# Patient Record
Sex: Female | Born: 1985 | Race: White | Hispanic: No | Marital: Married | Smoking: Never smoker
Health system: Southern US, Community
[De-identification: ages and names within clinical notes are randomized; demographics above are authoritative.]

## PROBLEM LIST (undated history)

## (undated) ENCOUNTER — Inpatient Hospital Stay (HOSPITAL_COMMUNITY): Payer: Self-pay

## (undated) DIAGNOSIS — K8020 Calculus of gallbladder without cholecystitis without obstruction: Secondary | ICD-10-CM

## (undated) DIAGNOSIS — F32A Depression, unspecified: Secondary | ICD-10-CM

## (undated) DIAGNOSIS — F329 Major depressive disorder, single episode, unspecified: Secondary | ICD-10-CM

## (undated) DIAGNOSIS — Z8619 Personal history of other infectious and parasitic diseases: Secondary | ICD-10-CM

## (undated) DIAGNOSIS — E282 Polycystic ovarian syndrome: Secondary | ICD-10-CM

## (undated) HISTORY — PX: OTHER SURGICAL HISTORY: SHX169

## (undated) HISTORY — DX: Polycystic ovarian syndrome: E28.2

## (undated) HISTORY — PX: CHOLECYSTECTOMY: SHX55

## (undated) HISTORY — DX: Personal history of other infectious and parasitic diseases: Z86.19

## (undated) HISTORY — DX: Major depressive disorder, single episode, unspecified: F32.9

## (undated) HISTORY — DX: Calculus of gallbladder without cholecystitis without obstruction: K80.20

## (undated) HISTORY — DX: Depression, unspecified: F32.A

---
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
## 2003-10-05 ENCOUNTER — Other Ambulatory Visit: Admission: RE | Admit: 2003-10-05 | Discharge: 2003-10-05 | Payer: Self-pay | Admitting: Obstetrics and Gynecology

## 2004-10-10 ENCOUNTER — Other Ambulatory Visit: Admission: RE | Admit: 2004-10-10 | Discharge: 2004-10-10 | Payer: Self-pay | Admitting: Obstetrics and Gynecology

## 2005-11-27 ENCOUNTER — Other Ambulatory Visit: Admission: RE | Admit: 2005-11-27 | Discharge: 2005-11-27 | Payer: Self-pay | Admitting: Obstetrics and Gynecology

## 2005-12-18 ENCOUNTER — Encounter: Admission: RE | Admit: 2005-12-18 | Discharge: 2005-12-18 | Payer: Self-pay | Admitting: Gastroenterology

## 2006-12-17 ENCOUNTER — Other Ambulatory Visit: Admission: RE | Admit: 2006-12-17 | Discharge: 2006-12-17 | Payer: Self-pay | Admitting: Obstetrics and Gynecology

## 2007-12-30 ENCOUNTER — Encounter: Payer: Self-pay | Admitting: Obstetrics and Gynecology

## 2007-12-30 ENCOUNTER — Other Ambulatory Visit: Admission: RE | Admit: 2007-12-30 | Discharge: 2007-12-30 | Payer: Self-pay | Admitting: Obstetrics and Gynecology

## 2007-12-30 ENCOUNTER — Ambulatory Visit: Payer: Self-pay | Admitting: Obstetrics and Gynecology

## 2008-06-30 ENCOUNTER — Ambulatory Visit: Payer: Self-pay | Admitting: Obstetrics and Gynecology

## 2008-07-07 IMAGING — US US ABDOMEN COMPLETE
1 series · 14 of 25 positions shown · non-contrast
Comparison: None.

ABDOMEN ULTRASOUND:

CLINICAL DATA: Abdominal pain
TECHNIQUE: Complete abdominal ultrasound examination was performed including
evaluation of the liver, gallbladder, bile ducts, pancreas, kidneys, spleen,
IVC, and abdominal aorta.

[Series 1: unknown · 14 of 67 slices shown]
[im 1/67]
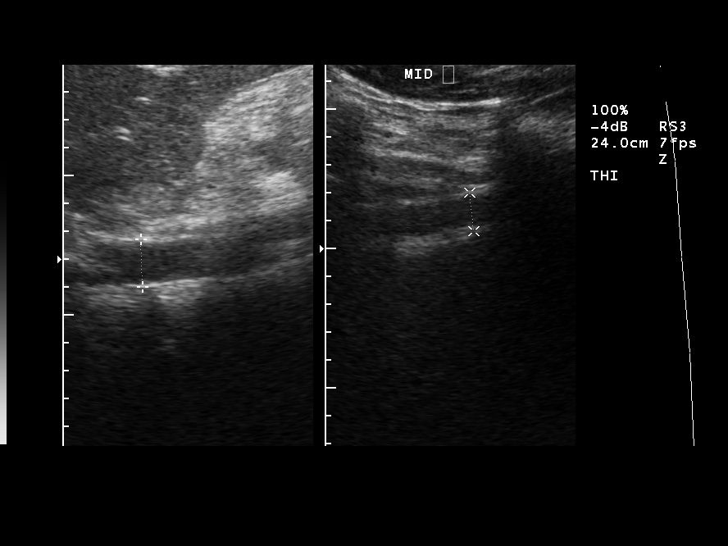
[im 6/67]
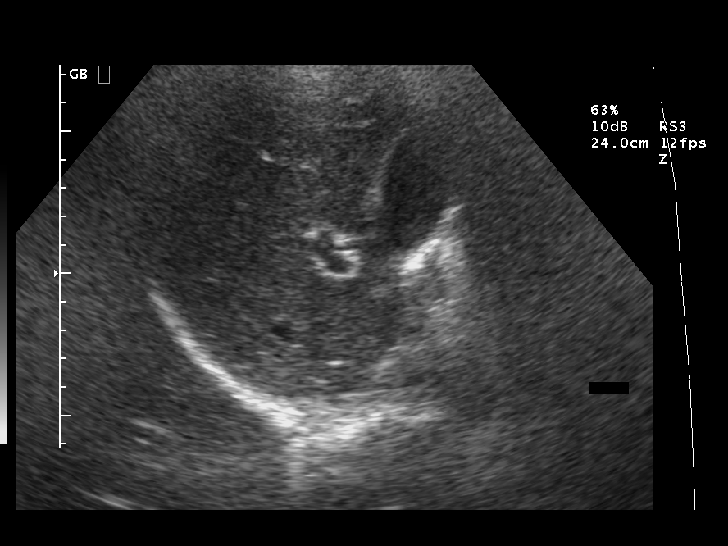
[im 12/67]
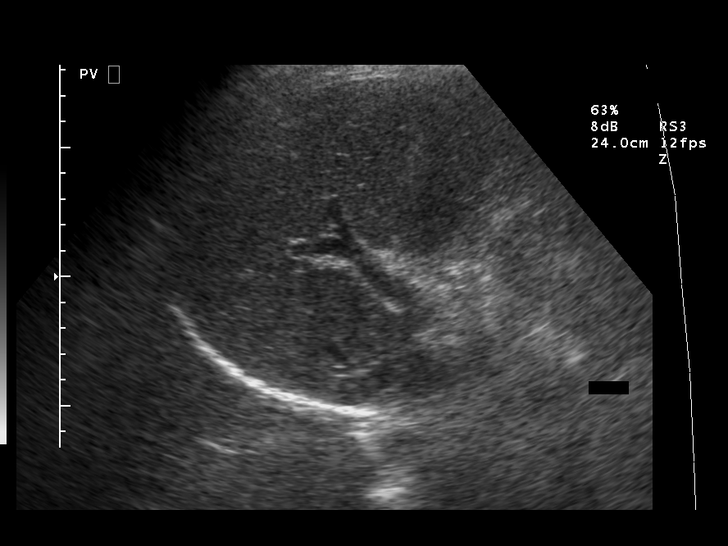
[im 17/67]
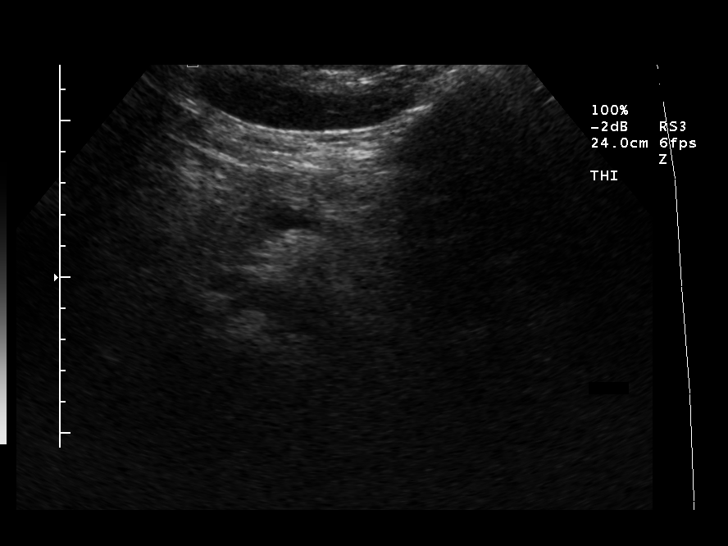
[im 23/67]
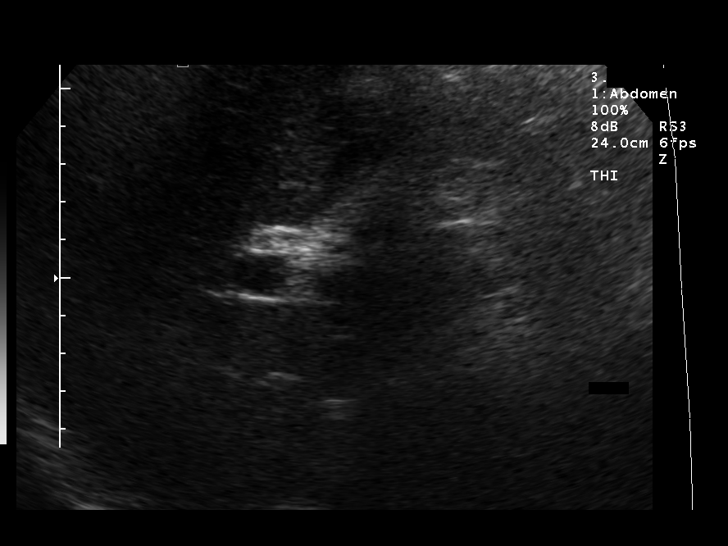
[im 25/67]
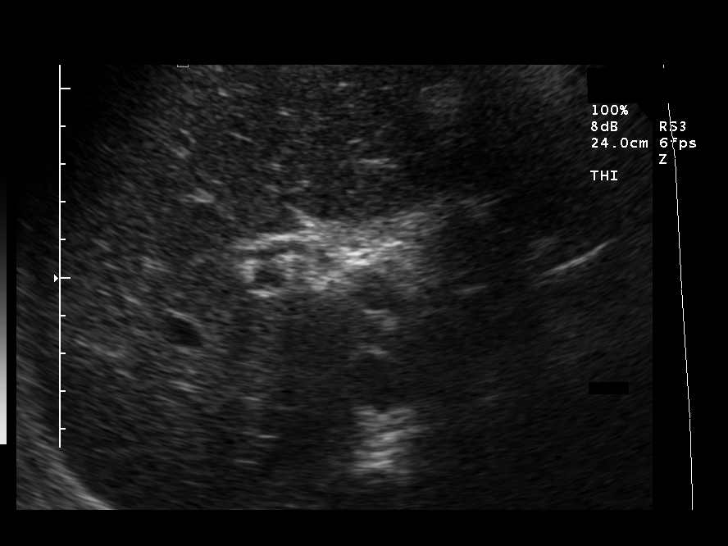
[im 31/67]
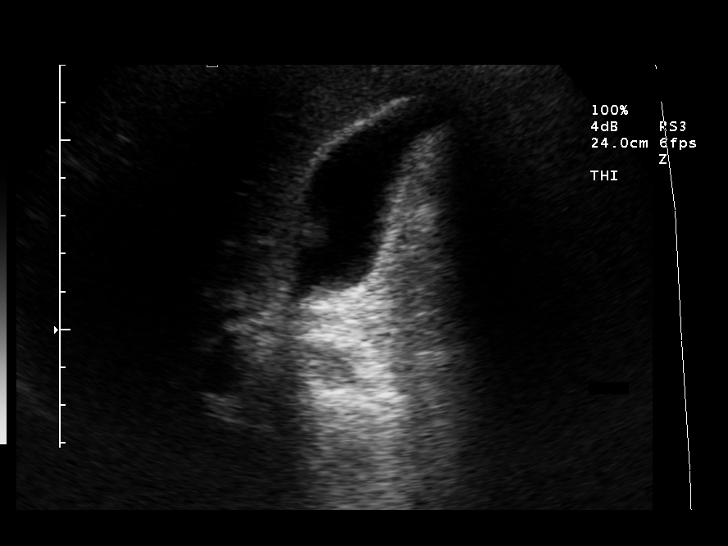
[im 36/67]
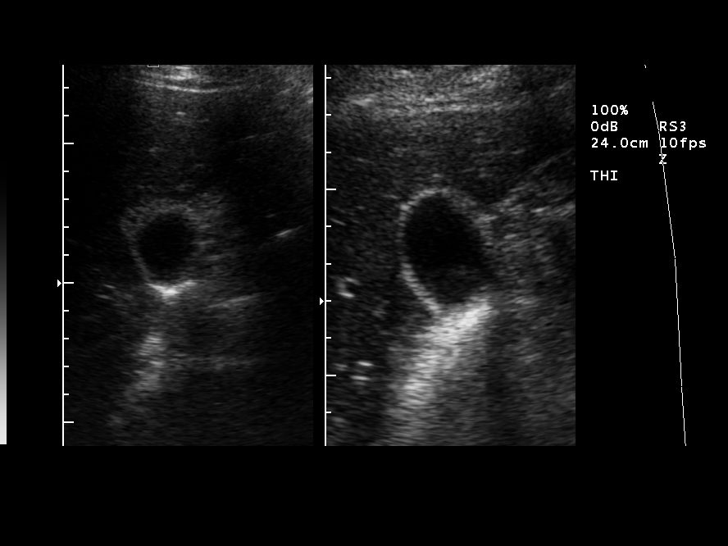
[im 42/67]
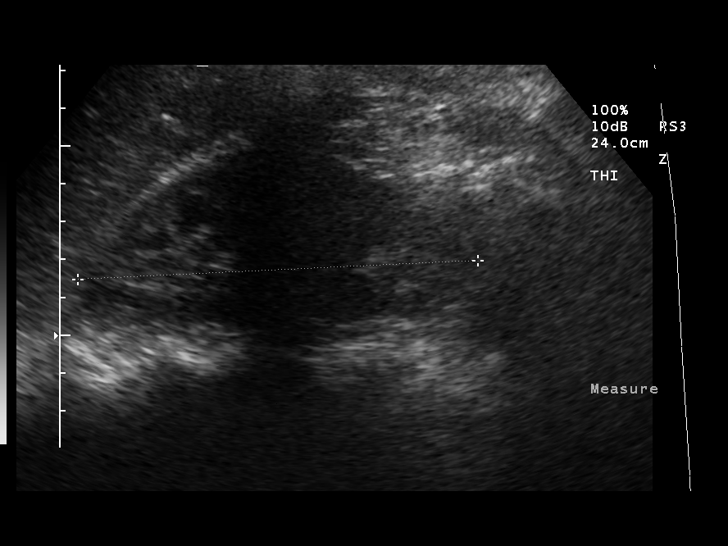
[im 45/67]
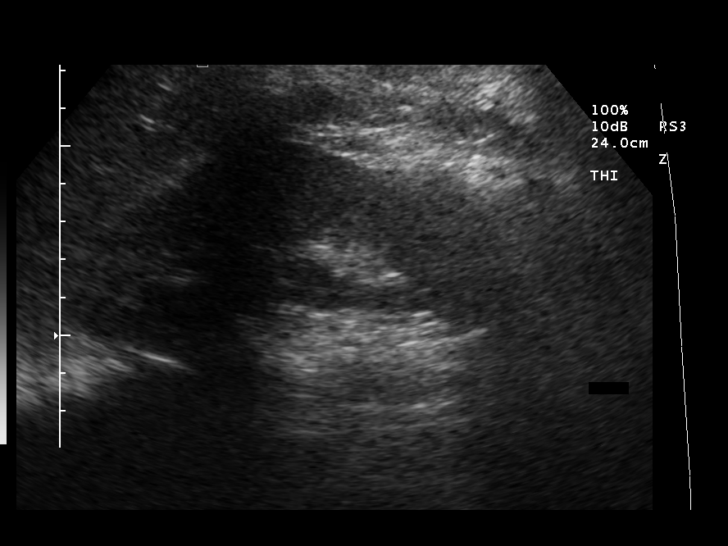
[im 50/67]
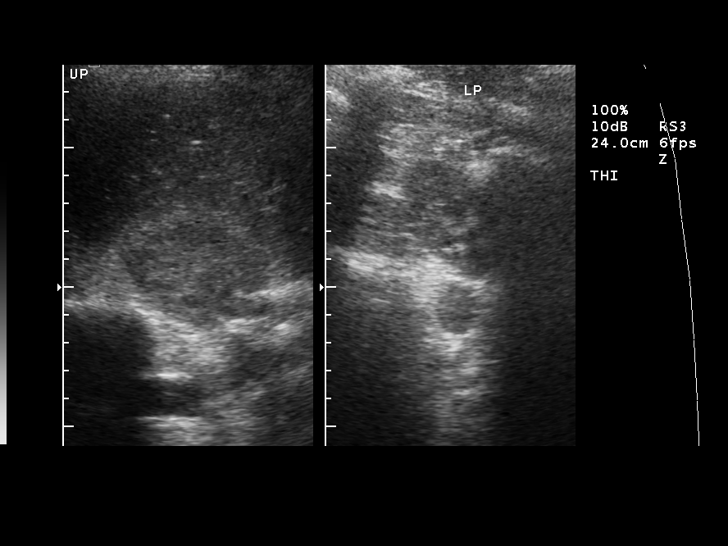
[im 56/67]
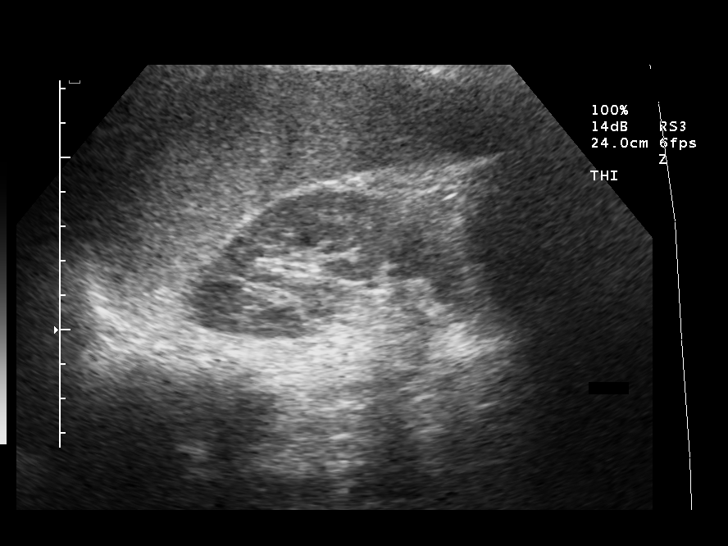
[im 61/67]
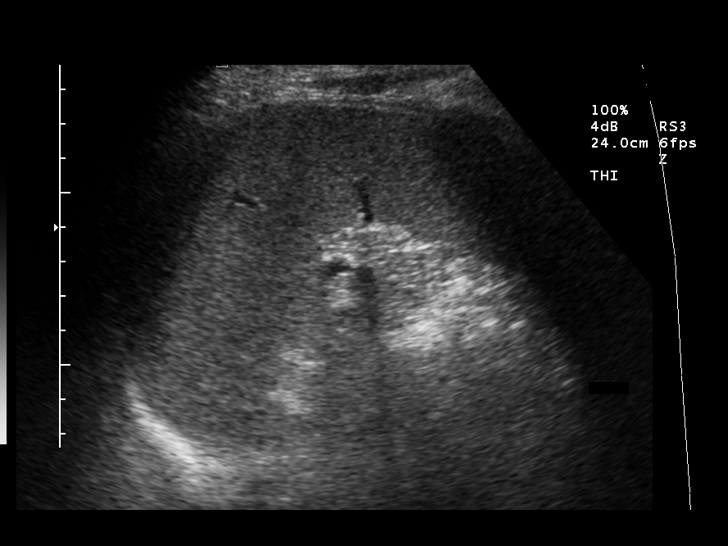
[im 67/67]
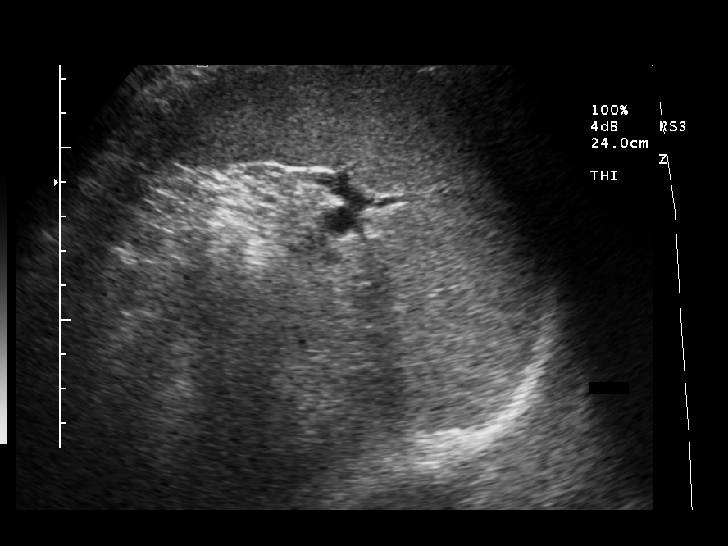

[14 of 25 positions shown; findings below may reference images not displayed]

FINDINGS: Gallbladder: Normal. Specifically, there is no evidence for gallstones,
gallbladder wall thickening or pericholecystic fluid.

Common Bile Duct:  Nondilated

Liver:  Normal

Inferior Vena Cava:  Not well seen secondary overlying bowel gas.

Pancreas:  Visualization limited secondary to overlying bowel gas.

Spleen:  Normal

Right Kidney:  10.6 cm in long axis.  Normal

Left Kidney:  11.1 cm in long axis.  Normal

Aorta:  No aneurysm
IMPRESSION: No abnormality identified with limited visualization of the pancreas.

## 2008-08-12 ENCOUNTER — Ambulatory Visit: Payer: Self-pay | Admitting: Obstetrics and Gynecology

## 2009-04-05 ENCOUNTER — Other Ambulatory Visit: Admission: RE | Admit: 2009-04-05 | Discharge: 2009-04-05 | Payer: Self-pay | Admitting: Obstetrics and Gynecology

## 2009-04-05 ENCOUNTER — Ambulatory Visit: Payer: Self-pay | Admitting: Obstetrics and Gynecology

## 2010-01-02 DIAGNOSIS — E282 Polycystic ovarian syndrome: Secondary | ICD-10-CM

## 2010-01-02 DIAGNOSIS — K8020 Calculus of gallbladder without cholecystitis without obstruction: Secondary | ICD-10-CM

## 2010-01-02 HISTORY — DX: Calculus of gallbladder without cholecystitis without obstruction: K80.20

## 2010-01-02 HISTORY — DX: Polycystic ovarian syndrome: E28.2

## 2010-04-18 ENCOUNTER — Encounter (INDEPENDENT_AMBULATORY_CARE_PROVIDER_SITE_OTHER): Payer: BLUE CROSS/BLUE SHIELD | Admitting: Obstetrics and Gynecology

## 2010-04-18 ENCOUNTER — Other Ambulatory Visit: Payer: Self-pay | Admitting: Obstetrics and Gynecology

## 2010-04-18 ENCOUNTER — Other Ambulatory Visit (HOSPITAL_COMMUNITY)
Admission: RE | Admit: 2010-04-18 | Discharge: 2010-04-18 | Disposition: A | Discharge status: Home / Self Care | Payer: BLUE CROSS/BLUE SHIELD | Source: Ambulatory Visit | Attending: Obstetrics and Gynecology | Admitting: Obstetrics and Gynecology

## 2010-04-18 DIAGNOSIS — Z833 Family history of diabetes mellitus: Secondary | ICD-10-CM

## 2010-04-18 DIAGNOSIS — Z124 Encounter for screening for malignant neoplasm of cervix: Secondary | ICD-10-CM | POA: Insufficient documentation

## 2010-04-18 DIAGNOSIS — Z01419 Encounter for gynecological examination (general) (routine) without abnormal findings: Secondary | ICD-10-CM

## 2010-05-02 ENCOUNTER — Ambulatory Visit (INDEPENDENT_AMBULATORY_CARE_PROVIDER_SITE_OTHER): Payer: BLUE CROSS/BLUE SHIELD | Admitting: Obstetrics and Gynecology

## 2010-05-02 ENCOUNTER — Other Ambulatory Visit: Payer: BLUE CROSS/BLUE SHIELD

## 2010-05-02 DIAGNOSIS — N949 Unspecified condition associated with female genital organs and menstrual cycle: Secondary | ICD-10-CM

## 2010-05-02 DIAGNOSIS — E282 Polycystic ovarian syndrome: Secondary | ICD-10-CM

## 2010-05-02 DIAGNOSIS — N83209 Unspecified ovarian cyst, unspecified side: Secondary | ICD-10-CM

## 2010-08-23 ENCOUNTER — Encounter (INDEPENDENT_AMBULATORY_CARE_PROVIDER_SITE_OTHER): Payer: Self-pay | Admitting: General Surgery

## 2010-08-23 ENCOUNTER — Ambulatory Visit (INDEPENDENT_AMBULATORY_CARE_PROVIDER_SITE_OTHER): Payer: BC Managed Care – PPO | Admitting: General Surgery

## 2010-08-23 VITALS — BP 132/76 | HR 62 | Temp 97.6°F | Ht 63.0 in | Wt 242.1 lb

## 2010-08-23 DIAGNOSIS — K8020 Calculus of gallbladder without cholecystitis without obstruction: Secondary | ICD-10-CM | POA: Insufficient documentation

## 2010-08-23 NOTE — Progress Notes (Signed)
Chief Complaint  Patient presents with  . Other    new pt-eval of gallstones    HPI [name] is a 25 y.o. female.   HPI This is a 25 year old female who presents with a history of nausea, vomiting, recent right upper quadrant abdominal pain and right back pain. She has a history in 2007 for being evaluated for nausea and emesis. At that point she really had a normal ultrasound as well as a normal upper GI. She did well until about a month ago she began developing nausea and some emesis with meat. She's had some occasional diarrhea. She is now also had several episodes of right upper quadrant pain that has been less than 30 minutes in nature after eating. This is also associated associated with right back pain. She has a history of polycystic ovarian syndrome which she's been recently diagnosed with which he has some constant abdominal cramping but this is different. She underwent an ultrasound that shows a 1.6 cm gallstone. There is no wall thickening or pericholecystic fluid. There is a negative sonographic Murphy sign or common bile duct is within normal limits. She was then referred for evaluation for cholecystectomy. Past Medical History  Diagnosis Date  . PCOS (polycystic ovarian syndrome) 2012  . Vitamin B12 deficiency   . Vitamin D deficiency   . Gallstones 2012    Past Surgical History  Procedure Date  . Wisdom tooth extraction 2003    History reviewed. No pertinent family history.  Social History History  Substance Use Topics  . Smoking status: Never Smoker   . Smokeless tobacco: Not on file  . Alcohol Use: Yes    Allergies  Allergen Reactions  . Sulfa Antibiotics     Current Outpatient Prescriptions  Medication Sig Dispense Refill  . Cholecalciferol (VITAMIN D PO) Take by mouth.        . Cyanocobalamin (B-12 PO) Take by mouth daily.          Review of Systems Review of Systems  Constitutional: Negative.   HENT: Negative.   Eyes: Negative.     Respiratory: Negative.   Cardiovascular: Negative.   Gastrointestinal: Negative.   Genitourinary: Negative.   Musculoskeletal: Negative.   Skin: Negative.   Neurological: Negative.   Endo/Heme/Allergies: Negative.   Psychiatric/Behavioral: Negative.     Blood pressure 132/76, pulse 62, temperature 97.6 F (36.4 C), height 5\' 3"  (1.6 m), weight 242 lb 2 oz (109.827 kg).  Physical Exam Physical Exam  Constitutional: She appears well-developed and well-nourished.  Eyes: No scleral icterus.  Neck: Neck supple.  Cardiovascular: Normal rate and normal heart sounds.   Respiratory: Effort normal and breath sounds normal. She has no wheezes. She has no rales.  GI: Soft. Bowel sounds are normal. She exhibits no distension and no mass. There is tenderness (mildly tender right upper quadrant and right back, no Murphys sign). There is no rebound and no guarding.  Lymphadenopathy:    She has no cervical adenopathy.       Assessment    Symptomatic Cholelithiasis    Plan    Some of her symptoms are certainly referable to her polycystic ovarian syndrome but these new recent onset pains with the gallstone that has been noted on ultrasound I certainly think are indicative of symptomatic cholelithiasis we discussed the pathophysiology of gallbladder disease. I told her I do think cholecystectomy is indicated due to her symptoms as well as her exam at this point. I discussed a laparoscopic cholecystectomy with her.  The risks being but not limited to bleeding, infection, open procedure, and common bile duct injury. She understands these risks and we'll plan on doing this in the next month or so.       [name] 08/23/2010, 9:34 AM

## 2010-09-01 ENCOUNTER — Encounter (HOSPITAL_COMMUNITY): Payer: BC Managed Care – PPO

## 2010-09-01 ENCOUNTER — Other Ambulatory Visit (INDEPENDENT_AMBULATORY_CARE_PROVIDER_SITE_OTHER): Payer: Self-pay | Admitting: General Surgery

## 2010-09-01 LAB — COMPREHENSIVE METABOLIC PANEL
AST: 17 U/L (ref 0–37)
Alkaline Phosphatase: 88 U/L (ref 39–117)
Creatinine, Ser: 0.69 mg/dL (ref 0.50–1.10)
GFR calc Af Amer: >60 mL/min (ref >60)
GFR calc non Af Amer: >60 mL/min (ref >60)
Glucose, Bld: 88 mg/dL (ref 70–99)
Sodium: 140 mEq/L (ref 135–145)
Total Bilirubin: 0.4 mg/dL (ref 0.3–1.2)

## 2010-09-01 LAB — SURGICAL PCR SCREEN
MRSA, PCR: NEGATIVE
Staphylococcus aureus: POSITIVE — AB

## 2010-09-01 LAB — DIFFERENTIAL
Eosinophils Absolute: 0.3 10*3/uL (ref 0.0–0.7)
Eosinophils Relative: 3 % (ref 0–5)
Lymphocytes Relative: 29 % (ref 12–46)
Lymphs Abs: 2.5 10*3/uL (ref 0.7–4.0)
Monocytes Relative: 7 % (ref 3–12)
Neutro Abs: 5.2 10*3/uL (ref 1.7–7.7)

## 2010-09-01 LAB — CBC
MCH: 30.1 pg (ref 26.0–34.0)
RDW: 12.3 % (ref 11.5–15.5)
WBC: 8.6 10*3/uL (ref 4.0–10.5)

## 2010-09-01 LAB — HCG, SERUM, QUALITATIVE: Preg, Serum: NEGATIVE

## 2010-09-08 ENCOUNTER — Telehealth (INDEPENDENT_AMBULATORY_CARE_PROVIDER_SITE_OTHER): Payer: Self-pay

## 2010-09-08 ENCOUNTER — Other Ambulatory Visit (INDEPENDENT_AMBULATORY_CARE_PROVIDER_SITE_OTHER): Payer: Self-pay | Admitting: General Surgery

## 2010-09-08 ENCOUNTER — Ambulatory Visit (HOSPITAL_COMMUNITY)
Admission: RE | Admit: 2010-09-08 | Discharge: 2010-09-08 | Disposition: A | Discharge status: Home / Self Care | Payer: BC Managed Care – PPO | Source: Ambulatory Visit | Attending: General Surgery | Admitting: General Surgery

## 2010-09-08 DIAGNOSIS — E669 Obesity, unspecified: Secondary | ICD-10-CM | POA: Insufficient documentation

## 2010-09-08 DIAGNOSIS — Z01812 Encounter for preprocedural laboratory examination: Secondary | ICD-10-CM | POA: Insufficient documentation

## 2010-09-08 DIAGNOSIS — K8010 Calculus of gallbladder with chronic cholecystitis without obstruction: Secondary | ICD-10-CM

## 2010-09-08 DIAGNOSIS — R1011 Right upper quadrant pain: Secondary | ICD-10-CM | POA: Insufficient documentation

## 2010-09-08 DIAGNOSIS — R112 Nausea with vomiting, unspecified: Secondary | ICD-10-CM | POA: Insufficient documentation

## 2010-09-08 NOTE — Telephone Encounter (Signed)
LMOM notifying pt that I rescheduled her appt from the 12th to 17th per Dr Rolan Bucco

## 2010-09-09 NOTE — Op Note (Signed)
[name], [name]             ACCOUNT NO.:  [id number]  MEDICAL RECORD NO.:  [id number]  LOCATION:  DAYL                         FACILITY:  [facility]  PHYSICIAN:  Juanetta Gosling, MDDATE OF BIRTH:  [birth year]  DATE OF PROCEDURE: DATE OF DISCHARGE:  09/08/2010                              OPERATIVE REPORT   PREOPERATIVE DIAGNOSIS:  Symptomatic cholelithiasis.  POSTOPERATIVE DIAGNOSIS:  Symptomatic cholelithiasis.  PROCEDURE:  Laparoscopic cholecystectomy.  SURGEON:  Juanetta Gosling, MD  ASSISTANT:  None.  ANESTHESIA:  General.  SPECIMEN:  Gallbladder and contents to pathology.  ESTIMATED BLOOD LOSS:  Minimal.  COMPLICATIONS:  None.  DRAINS:  None.  DISPOSITION:  The patient to recovery room in stable condition.  INDICATIONS:  This is a 25 year old female with history of nausea, vomiting, and recent onset of right upper quadrant abdominal pain as well as right back pain.  She has a normal evaluation in 2007, for nausea and emesis.  She did well until about a month ago when she began having nausea and some emesis while eating some food associated with some occasional diarrhea.  She has also had several episodes of right upper quadrant pain that have been less than 30 minutes in nature right after she is eating, associated with right back pain as well.  She underwent an ultrasound now that showed a 1.6-cm gallstone.  She was mildly tender in the right upper quadrant on her examination.  She and I discussed it certainly some of her symptoms sound like they are referable gallbladder should be reasonable to proceed with a laparoscopic cholecystectomy at this point.  PROCEDURE IN DETAIL:  After informed consent was obtained, the patient was taken to the operating room.  She was administered 2 g of IV cefoxitin.  Sequential compression devices were placed on lower extremities prior to induction with anesthesia.  She was placed under general anesthesia without  complication.  Her abdomen was prepped and draped in standard sterile surgical fashion.  Surgical time-out was then performed.  I again infiltrated 0.25% Marcaine below her umbilicus and made a vertical incision and carried this down to the level of fascia.  This was grasped and brought up.  Her abdominal wall was very thickened and this was somewhat difficult to put the Hasson trocar in, but I incised the fascia with a 11 blade.  I then entered peritoneum bluntly.  I then placed a Hasson trocar, which barely inserted through abdominal wall and insufflated the abdomen with 15 mmHg pressure.  I then placed three further 5-mm ports in the right side of mid abdomen under direct vision after infiltration of local anesthetic without complication.  Her gallbladder was then retracted cephalad and lateral.  Her gallbladder was small and contracted and had evidence of chronic cholecystitis. This was retracted cephalad and lateral.  I eventually was able to dissect in her triangle of Calot and obtained a critical view of safety. I did not do a cholangiogram due to obtaining the critical view of safety as well as normal liver function tests preoperatively.  I clipped the artery 3 times and divided leaving 2 clips in place and treated the duct in a similar fashion.  The gallbladder was then  removed from the liver bed without any difficulty.  I did, however, with my grasper spill some bile making this a class III case.  This was controlled and was not a significant amount of leakage of bile and no stones were spilled.  The gallbladder eventually was removed from the liver bed.  This was placed in an EndoCatch bag and removed from the umbilicus.  I then irrigated this copiously until it was clear.  I evacuated the irrigation. Hemostasis was obtained.  I then removed the Hasson trocar and closed this pursestring stitch that I have placed previously of 2-0 Vicryl. This obliterated the defect completely.   I viewed this from the epigastric incision.  There was no evidence of  injury and the defect was obliterated.  I then desufflated the abdomen, removed all trocars. Incisions were closed with 4-0 Monocryl and Dermabond.  She tolerated this well, was extubated, and transferred to recovery room in stable condition.     Juanetta Gosling, MD     MCW/MEDQ  D:  09/08/2010  T:  09/08/2010  Job:  [id number]  cc:   Lonie Peak, PA Fax: [phone]  Electronically Signed by Emelia Loron MD on 09/09/2010 08:06:46 AM

## 2010-09-14 ENCOUNTER — Encounter (INDEPENDENT_AMBULATORY_CARE_PROVIDER_SITE_OTHER): Payer: BC Managed Care – PPO | Admitting: General Surgery

## 2010-09-19 ENCOUNTER — Ambulatory Visit (INDEPENDENT_AMBULATORY_CARE_PROVIDER_SITE_OTHER): Payer: BC Managed Care – PPO | Admitting: General Surgery

## 2010-09-19 ENCOUNTER — Encounter (INDEPENDENT_AMBULATORY_CARE_PROVIDER_SITE_OTHER): Payer: Self-pay | Admitting: General Surgery

## 2010-09-19 VITALS — BP 134/68 | HR 68 | Temp 97.3°F | Ht 63.0 in | Wt 243.6 lb

## 2010-09-19 DIAGNOSIS — Z09 Encounter for follow-up examination after completed treatment for conditions other than malignant neoplasm: Secondary | ICD-10-CM

## 2010-09-19 NOTE — Progress Notes (Signed)
Subjective:     Patient ID: [name], female   DOB: 1985/06/11, 25 y.o.   MRN: [id number]  HPI This is a 25 year old female who had symptomatic cholelithiasis. I took her to the operating room recently into a laparoscopic cholecystectomy. This was uneventful. She's doing very well right now and her symptoms preoperatively and now resolved. Her pathology shows chronic cholecystitis and cholelithiasis. She has no complaints today.  Review of Systems     Objective:   Physical Exam Healing laparoscopic incisions without infection    Assessment:     S/p lap chole    Plan:         She can return to work with no restrictions. I asked her to come back and see me as needed.

## 2010-10-27 ENCOUNTER — Telehealth: Payer: Self-pay | Admitting: *Deleted

## 2010-10-27 NOTE — Telephone Encounter (Signed)
Patient called to say she has had what she thought was yeast symptoms.  She has had the symptoms for a couple weeks.  Tried otc cream for a couple treatments.  Advised to come in and we will get her checked out. Patient stated that she had an rx at home that she didn't fill before for bacteria.  She said she will fill that rx and if it dont work she will call the office for an ov to eval.

## 2011-04-20 DIAGNOSIS — IMO0001 Reserved for inherently not codable concepts without codable children: Secondary | ICD-10-CM | POA: Insufficient documentation

## 2011-04-20 DIAGNOSIS — E538 Deficiency of other specified B group vitamins: Secondary | ICD-10-CM | POA: Insufficient documentation

## 2011-04-20 DIAGNOSIS — E282 Polycystic ovarian syndrome: Secondary | ICD-10-CM | POA: Insufficient documentation

## 2011-05-01 ENCOUNTER — Ambulatory Visit (INDEPENDENT_AMBULATORY_CARE_PROVIDER_SITE_OTHER): Payer: BC Managed Care – PPO | Admitting: Obstetrics and Gynecology

## 2011-05-01 ENCOUNTER — Encounter: Payer: Self-pay | Admitting: Obstetrics and Gynecology

## 2011-05-01 ENCOUNTER — Other Ambulatory Visit (HOSPITAL_COMMUNITY)
Admission: RE | Admit: 2011-05-01 | Discharge: 2011-05-01 | Disposition: A | Discharge status: Home / Self Care | Payer: BC Managed Care – PPO | Source: Ambulatory Visit | Attending: Obstetrics and Gynecology | Admitting: Obstetrics and Gynecology

## 2011-05-01 VITALS — BP 130/80 | Ht 62.0 in | Wt 242.0 lb

## 2011-05-01 DIAGNOSIS — N949 Unspecified condition associated with female genital organs and menstrual cycle: Secondary | ICD-10-CM

## 2011-05-01 DIAGNOSIS — Z833 Family history of diabetes mellitus: Secondary | ICD-10-CM

## 2011-05-01 DIAGNOSIS — R102 Pelvic and perineal pain: Secondary | ICD-10-CM

## 2011-05-01 DIAGNOSIS — R5381 Other malaise: Secondary | ICD-10-CM

## 2011-05-01 DIAGNOSIS — Z01419 Encounter for gynecological examination (general) (routine) without abnormal findings: Secondary | ICD-10-CM | POA: Insufficient documentation

## 2011-05-01 DIAGNOSIS — F32A Depression, unspecified: Secondary | ICD-10-CM | POA: Insufficient documentation

## 2011-05-01 DIAGNOSIS — R5383 Other fatigue: Secondary | ICD-10-CM

## 2011-05-01 DIAGNOSIS — F329 Major depressive disorder, single episode, unspecified: Secondary | ICD-10-CM | POA: Insufficient documentation

## 2011-05-01 DIAGNOSIS — N898 Other specified noninflammatory disorders of vagina: Secondary | ICD-10-CM

## 2011-05-01 LAB — WET PREP FOR TRICH, YEAST, CLUE
Clue Cells Wet Prep HPF POC: NONE SEEN
Yeast Wet Prep HPF POC: NONE SEEN

## 2011-05-01 MED ORDER — METRONIDAZOLE 0.75 % VA GEL: 1.0000 | Freq: Two times a day (BID) | VAGINAL | Status: AC

## 2011-05-01 NOTE — Progress Notes (Signed)
Patient came back to see me today for her annual GYN exam. She has monthly withdrawal with her IUD. It is extremely light. She is contemplating pregnancy but is not ready yet. She is worried because we told her she had PCO. She has had a gallbladder removed since I last saw her. She is having some trouble with depression. She is having a vaginal discharge and urinary frequency. She is having pelvic pain that is almost daily. It is worse on the left than on the right.  Physical examination: Kennon Portela present. HEENT within normal limits. Neck: Thyroid not large. No masses. Supraclavicular nodes: not enlarged. Breasts: Examined in both sitting and lying  position. No skin changes and no masses. Abdomen: Soft no guarding rebound or masses or hernia. Pelvic: External: Within normal limits. BUS: Within normal limits. Vaginal:within normal limits. Good estrogen effect. No evidence of cystocele rectocele or enterocele. Cervix: clean. Uterus: Normal size and shape. Adnexa: No masses. Rectovaginal exam: Confirmatory and negative. Extremities: Within normal limits. IUD string was not visible today.( patient felt it  a month ago) wet prep shows many bacteria with a yeast, clue cells were positive amine test.  Assessment: #1. Vaginal discharge #2. Pelvic pain #3. Depression #4. PCO S. #5. Urinary frequency #6. Bacterial vaginosis.  Plan: Discussed her depression. Referred to [employer]. Pelvic ultrasound scheduled. Discussed treatment of PCO when she's ready to get pregnant. Appropriate lab work done. MetroGel vaginal cream for BV.

## 2011-05-01 NOTE — Patient Instructions (Signed)
Schedule pelvic ultrasound

## 2011-05-02 LAB — CBC WITH DIFFERENTIAL/PLATELET
Basophils Absolute: 0 10*3/uL (ref 0.0–0.1)
Basophils Relative: 0 % (ref 0–1)
Eosinophils Absolute: 0.2 10*3/uL (ref 0.0–0.7)
Hemoglobin: 13.9 g/dL (ref 12.0–15.0)
Lymphocytes Relative: 24 % (ref 12–46)
Lymphs Abs: 2.2 10*3/uL (ref 0.7–4.0)
MCV: 90 fL (ref 78.0–100.0)
Neutro Abs: 6.2 10*3/uL (ref 1.7–7.7)
Neutrophils Relative %: 67 % (ref 43–77)
RBC: 4.5 MIL/uL (ref 3.87–5.11)
RDW: 13 % (ref 11.5–15.5)

## 2011-05-02 LAB — URINALYSIS W MICROSCOPIC + REFLEX CULTURE
Bacteria, UA: NONE SEEN
Casts: NONE SEEN
Nitrite: NEGATIVE
Protein, ur: NEGATIVE mg/dL
Urobilinogen, UA: 0.2 mg/dL (ref 0.0–1.0)

## 2011-05-15 ENCOUNTER — Ambulatory Visit (INDEPENDENT_AMBULATORY_CARE_PROVIDER_SITE_OTHER): Payer: BC Managed Care – PPO

## 2011-05-15 ENCOUNTER — Ambulatory Visit (INDEPENDENT_AMBULATORY_CARE_PROVIDER_SITE_OTHER): Payer: BC Managed Care – PPO | Admitting: Obstetrics and Gynecology

## 2011-05-15 DIAGNOSIS — R102 Pelvic and perineal pain: Secondary | ICD-10-CM

## 2011-05-15 DIAGNOSIS — N949 Unspecified condition associated with female genital organs and menstrual cycle: Secondary | ICD-10-CM

## 2011-05-15 DIAGNOSIS — N83209 Unspecified ovarian cyst, unspecified side: Secondary | ICD-10-CM

## 2011-05-15 DIAGNOSIS — N831 Corpus luteum cyst of ovary, unspecified side: Secondary | ICD-10-CM

## 2011-05-15 DIAGNOSIS — N8300 Follicular cyst of ovary, unspecified side: Secondary | ICD-10-CM

## 2011-05-15 NOTE — Progress Notes (Signed)
Patient came to see me today for an ultrasound due to pelvic pain. On ultrasound her uterus is anteverted with her IUD in normal position in the endometrium. Her endometrial echo is 2 mm. Her right ovary is normal. Her left ovary does show a thick walled cyst of 1.9 cm. It is not characteristic of an endometrioma. It is negative for color flow Doppler. It appears similar to a cyst seen on the left ovary in April of 2012. Her cul-de-sac is free of fluid. Unlike her ultrasound one year ago the appearance of the ovaries does not appear polycystic.  We had a long discussion about her pain, endometriosis, polycystic ovaries. Her mother had endometriosis. Her pain is tolerable at the moment. We discussed laparoscopy if it  gets worse. ACOG  booklet given. We discussed fertility. She will continue with her IUD. We will do a followup ultrasound in August.

## 2011-10-02 ENCOUNTER — Ambulatory Visit (INDEPENDENT_AMBULATORY_CARE_PROVIDER_SITE_OTHER): Payer: BC Managed Care – PPO | Admitting: Women's Health

## 2011-10-02 DIAGNOSIS — N898 Other specified noninflammatory disorders of vagina: Secondary | ICD-10-CM

## 2011-10-02 NOTE — Progress Notes (Signed)
Patient ID: [name], female   DOB: 08-08-1985, 26 y.o.   MRN: [id number] Presents with the complaint of vaginal discharge with odor. Contraceptives with Mirena  IUD with rare bleeding. Denies a fever or UTI symptoms.  Exam: Abdomen obese nontender. External genitalia within normal limits, speculum exam scant discharge with no odor or erythema noted, IUD strings at os. Wet prep negative. Bimanual slight discomfort with exam, no CMT or adnexal fullness or tenderness.  Normal exam  Plan: Reviewed normality of discharge and exam. Instructed to call if symptoms persist.

## 2012-03-18 ENCOUNTER — Telehealth: Payer: Self-pay | Admitting: *Deleted

## 2012-03-18 NOTE — Telephone Encounter (Signed)
(  Dr.G patient) she will see you for her provider now. Pt has mirena IUD and due for removal in august this year, she has noticed that she is bleeding more frequently last couple of months. Pt said prior to the period like bleeding she would only have spotting about every 3 months. She is concerned if the IUD is still effective? she does want to have a child, but not at this moment. Please advise

## 2012-03-18 NOTE — Telephone Encounter (Signed)
Yes Still off effective. We could have Dr. Audie Box remove and replace on same office visit this July and have her schedule annual exam several weeks later with me, can trim strings if need be.

## 2012-03-19 NOTE — Telephone Encounter (Signed)
Pt informed with the below note. 

## 2012-07-08 ENCOUNTER — Encounter: Payer: Self-pay | Admitting: Women's Health

## 2012-07-08 ENCOUNTER — Ambulatory Visit (INDEPENDENT_AMBULATORY_CARE_PROVIDER_SITE_OTHER): Payer: BC Managed Care – PPO | Admitting: Women's Health

## 2012-07-08 VITALS — BP 122/68 | Ht 62.0 in | Wt 233.0 lb

## 2012-07-08 DIAGNOSIS — Z01419 Encounter for gynecological examination (general) (routine) without abnormal findings: Secondary | ICD-10-CM

## 2012-07-08 DIAGNOSIS — Z23 Encounter for immunization: Secondary | ICD-10-CM

## 2012-07-08 DIAGNOSIS — Z833 Family history of diabetes mellitus: Secondary | ICD-10-CM

## 2012-07-08 DIAGNOSIS — B379 Candidiasis, unspecified: Secondary | ICD-10-CM

## 2012-07-08 LAB — CBC WITH DIFFERENTIAL/PLATELET
Basophils Absolute: 0.1 10*3/uL (ref 0.0–0.1)
HCT: 41.5 % (ref 36.0–46.0)
Hemoglobin: 14.2 g/dL (ref 12.0–15.0)
Lymphocytes Relative: 27 % (ref 12–46)
MCH: 29.2 pg (ref 26.0–34.0)
MCHC: 34.2 g/dL (ref 30.0–36.0)
MCV: 85.2 fL (ref 78.0–100.0)
Monocytes Absolute: 0.7 10*3/uL (ref 0.1–1.0)
Monocytes Relative: 8 % (ref 3–12)
Neutro Abs: 4.9 10*3/uL (ref 1.7–7.7)
Platelets: 300 10*3/uL (ref 150–400)
RBC: 4.87 MIL/uL (ref 3.87–5.11)
RDW: 13.1 % (ref 11.5–15.5)

## 2012-07-08 MED ORDER — FLUCONAZOLE 150 MG PO TABS: 150.0000 mg | ORAL_TABLET | Freq: Once | ORAL | Status: DC

## 2012-07-08 NOTE — Patient Instructions (Addendum)

## 2012-07-08 NOTE — Progress Notes (Signed)
[name] 07/23/1985 [id number]    History:    The patient presents for annual exam.  Mirena IUD placed August 2009,scheduled to be removed August 2014. History of PCO S. With irregular cycles. Obesity. Normal Pap history. Has had problems with depression in the past but has not sought counseling. Teary-eyed today, desires children but questions if she is ready.   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 Cholecystectomy 2012.   ROS:  A  ROS was performed and pertinent positives and negatives are included in the history.  Exam:  Filed Vitals:   07/08/12 0941  BP: 122/68    General appearance:  Normal Head/Neck:  Normal, without cervical or supraclavicular adenopathy. Thyroid:  Symmetrical, normal in size, without palpable masses or nodularity. Respiratory  Effort:  Normal  Auscultation:  Clear without wheezing or rhonchi Cardiovascular  Auscultation:  Regular rate, without rubs, murmurs or gallops  Edema/varicosities:  Not grossly evident Abdominal  Soft,nontender, without masses, guarding or rebound.  Liver/spleen:  No organomegaly noted  Hernia:  None appreciated  Skin  Inspection:  Grossly normal  Palpation:  Grossly normal Neurologic/psychiatric  Orientation:  Normal with appropriate conversation.  Mood/affect:  Normal  Genitourinary    Breasts: Examined lying and sitting.     Right: Without masses, retractions, discharge or axillary adenopathy.     Left: Without masses, retractions, discharge or axillary adenopathy.   Inguinal/mons:  Normal without inguinal adenopathy  External genitalia:  Normal  BUS/Urethra/Skene's glands:  Normal  Bladder:  Normal  Vagina:  Normal  Cervix:  Normal IUD strings visible  Uterus:  normal in size, shape and contour.  Midline and mobile  Adnexa/parametria:     Rt: Without masses or  tenderness.   Lt: Without masses or tenderness.  Anus and perineum: Normal  Digital rectal exam: Normal sphincter tone without palpated masses or tenderness  Assessment/Plan:  27 y.o.  for annual exam.     Mirena IUD Obesity  Plan: Return to office to have IUD removed in August 2014. CBC, glucose, rubella titer, UA, Pap normal 2013, new screening guidelines reviewed. SBE's, continue to increase exercise, decrease calories for weight loss and health. Folic acid or MVI daily encouraged. Weight Watchers reviewed and encouraged. Counseling encouraged, declines at this time. States will use no contraception after Mirena removed and if pregnancy occurs will be happy.    Harrington Challenger [facility], 10:23 AM 07/08/2012

## 2012-07-08 NOTE — Addendum Note (Signed)
Addended by: Richardson Chiquito on: 07/08/2012 04:36 PM   Modules accepted: Orders

## 2012-07-09 LAB — URINALYSIS W MICROSCOPIC + REFLEX CULTURE
Bacteria, UA: NONE SEEN
Glucose, UA: NEGATIVE mg/dL
Hgb urine dipstick: NEGATIVE
Protein, ur: NEGATIVE mg/dL
Squamous Epithelial / LPF: NONE SEEN

## 2012-07-09 LAB — RUBELLA SCREEN: Rubella: 1.41 Index — ABNORMAL HIGH (ref <0.90)

## 2013-03-03 ENCOUNTER — Ambulatory Visit (INDEPENDENT_AMBULATORY_CARE_PROVIDER_SITE_OTHER): Payer: BC Managed Care – PPO | Admitting: Gynecology

## 2013-03-03 ENCOUNTER — Encounter: Payer: Self-pay | Admitting: Gynecology

## 2013-03-03 DIAGNOSIS — N926 Irregular menstruation, unspecified: Secondary | ICD-10-CM

## 2013-03-03 DIAGNOSIS — N898 Other specified noninflammatory disorders of vagina: Secondary | ICD-10-CM

## 2013-03-03 DIAGNOSIS — L293 Anogenital pruritus, unspecified: Secondary | ICD-10-CM

## 2013-03-03 DIAGNOSIS — Z30432 Encounter for removal of intrauterine contraceptive device: Secondary | ICD-10-CM

## 2013-03-03 LAB — WET PREP FOR TRICH, YEAST, CLUE
CLUE CELLS WET PREP: NONE SEEN
Trich, Wet Prep: NONE SEEN
Yeast Wet Prep HPF POC: NONE SEEN

## 2013-03-03 MED ORDER — METRONIDAZOLE 500 MG PO TABS: 500.0000 mg | ORAL_TABLET | Freq: Two times a day (BID) | ORAL | Status: DC

## 2013-03-03 NOTE — Progress Notes (Signed)
[name] [birth year] [id number]    28 y.o.  G0P0 presents with 2 issues:  1. Vaginal discharge over the past week. Use OTC Monistat with some relief but still with itching. 2. Once Mirena IUD removed. At five-year mark of August 2014. Using condoms contraception but wanted to pursue pregnancy.   Past medical history,surgical history, problem list, medications, allergies, family history and social history were all reviewed and documented in the EPIC chart.  Exam: Kim assistant General appearance  Normal External BUS vagina with slight white discharge. Cervix normal with IUD string visualized. Uterus normal size midline mobile nontender. Adnexa without masses or tenderness.  Procedure: IUD string was grasped with a [place] forcep and her Mirena IUD was removed, shown to the patient and discarded.  Assessment/Plan:  28 y.o. G0P0 :  1. Vaginal discharge. Wet prep consistent with low grade bacterial vaginosis. Will treat with Flagyl 500 mg twice a day x7 days, alcohol avoidance reviewed. 2. Mirena IUD removed. Attempting pregnancy.  Asked to wait 2 spontaneous menses before trying.  Currently on prenatal vitamins and will continue. Actually already has an appointment with obstetrician in town for a preconceptual appointment. Does have history of PCOS in the past. If without spontaneous menses within 8 weeks will call for progesterone withdrawal. Possibilities for stimulated cycle such as Clomid discussed. Option is to followup with the obstetrician for continued care versus following up here first for documentation and viability also reviewed. Patient will decide how she wants to go as far as following up with the obstetrician from now on or will followup here if needs ovulatory stimulation at her discretion.   Note: This document was prepared with digital dictation and possible smart phrase technology. Any transcriptional errors that result from this process are  unintentional.   Dara LordsFONTAINE,Samah Lapiana P MD, 2:35 PM 03/03/2013

## 2013-03-03 NOTE — Patient Instructions (Signed)
Take Flagyl medication twice daily for 7 days. Avoid alcohol while taking. Followup if you have not started spontaneous menses within 8 weeks.

## 2013-03-31 ENCOUNTER — Other Ambulatory Visit: Payer: Self-pay | Admitting: Obstetrics and Gynecology

## 2013-12-16 ENCOUNTER — Other Ambulatory Visit: Payer: Self-pay | Admitting: Obstetrics and Gynecology

## 2013-12-30 ENCOUNTER — Telehealth: Payer: Self-pay | Admitting: *Deleted

## 2013-12-30 NOTE — Telephone Encounter (Signed)
Pt asked if you would call her at work at [phone] she is being seen at [facility] currently pregnant. Would like you thoughts on some news she found out. I explained to pt that you just got back today and I would relay to you. Please advise

## 2013-12-30 NOTE — Telephone Encounter (Signed)
Telephone call,  questions answered, having bleeding, no gross in fetus, no fetal heart tones has follow-up ultrasound next week. Condolences given.

## 2014-09-15 LAB — OB RESULTS CONSOLE ANTIBODY SCREEN: ANTIBODY SCREEN: NEGATIVE

## 2014-09-15 LAB — OB RESULTS CONSOLE HIV ANTIBODY (ROUTINE TESTING): HIV: NONREACTIVE

## 2014-09-15 LAB — OB RESULTS CONSOLE ABO/RH: RH TYPE: POSITIVE

## 2014-09-15 LAB — OB RESULTS CONSOLE HEPATITIS B SURFACE ANTIGEN: Hepatitis B Surface Ag: NEGATIVE

## 2014-09-15 LAB — OB RESULTS CONSOLE RPR: RPR: NONREACTIVE

## 2014-09-15 LAB — OB RESULTS CONSOLE GC/CHLAMYDIA
Chlamydia: NEGATIVE
GC PROBE AMP, GENITAL: NEGATIVE

## 2014-09-15 LAB — OB RESULTS CONSOLE RUBELLA ANTIBODY, IGM: Rubella: IMMUNE

## 2014-09-18 ENCOUNTER — Encounter (HOSPITAL_COMMUNITY): Payer: Self-pay | Admitting: *Deleted

## 2014-09-18 ENCOUNTER — Inpatient Hospital Stay (HOSPITAL_COMMUNITY)
Admission: AD | Admit: 2014-09-18 | Discharge: 2014-09-18 | Disposition: A | Discharge status: Home / Self Care | Payer: 59 | Source: Ambulatory Visit | Attending: Obstetrics and Gynecology | Admitting: Obstetrics and Gynecology

## 2014-09-18 ENCOUNTER — Inpatient Hospital Stay (HOSPITAL_COMMUNITY): Payer: 59

## 2014-09-18 DIAGNOSIS — O208 Other hemorrhage in early pregnancy: Secondary | ICD-10-CM | POA: Insufficient documentation

## 2014-09-18 DIAGNOSIS — R102 Pelvic and perineal pain: Secondary | ICD-10-CM | POA: Diagnosis present

## 2014-09-18 DIAGNOSIS — Z3A11 11 weeks gestation of pregnancy: Secondary | ICD-10-CM | POA: Diagnosis not present

## 2014-09-18 DIAGNOSIS — O43891 Other placental disorders, first trimester: Secondary | ICD-10-CM

## 2014-09-18 DIAGNOSIS — O209 Hemorrhage in early pregnancy, unspecified: Secondary | ICD-10-CM

## 2014-09-18 DIAGNOSIS — O468X1 Other antepartum hemorrhage, first trimester: Secondary | ICD-10-CM

## 2014-09-18 DIAGNOSIS — O418X10 Other specified disorders of amniotic fluid and membranes, first trimester, not applicable or unspecified: Secondary | ICD-10-CM

## 2014-09-18 LAB — URINE MICROSCOPIC-ADD ON

## 2014-09-18 LAB — URINALYSIS, ROUTINE W REFLEX MICROSCOPIC
Bilirubin Urine: NEGATIVE
Glucose, UA: NEGATIVE mg/dL
Ketones, ur: 15 mg/dL — AB
LEUKOCYTES UA: NEGATIVE
Nitrite: NEGATIVE
PROTEIN: 30 mg/dL — AB
UROBILINOGEN UA: 0.2 mg/dL (ref 0.0–1.0)
pH: 6.5 (ref 5.0–8.0)

## 2014-09-18 LAB — POCT PREGNANCY, URINE: PREG TEST UR: POSITIVE — AB

## 2014-09-18 NOTE — MAU Provider Note (Signed)
History     CSN: [id number]  Arrival date and time: 09/18/14 1238   First Provider Initiated Contact with Patient 09/18/14 1316      No chief complaint on file.  Vaginal Bleeding The patient's primary symptoms include pelvic pain (slight cramping) and vaginal bleeding. The patient's pertinent negatives include no genital itching, genital lesions or genital odor. This is a recurrent problem. The current episode started today. The problem occurs intermittently. The problem has been gradually improving. The pain is mild. The problem affects both sides. She is pregnant. Associated symptoms include abdominal pain. Pertinent negatives include no back pain, chills, constipation, diarrhea, fever, flank pain, nausea, urgency or vomiting. The vaginal discharge was bloody. The vaginal bleeding is typical of menses. She has not been passing clots. She has not been passing tissue. Nothing aggravates the symptoms. She has tried nothing for the symptoms.   This is a 29 y.o. female at [redacted]w[redacted]d who presents with c/o heavy vaginal bleeding which started this morning, a couple of hours ago. Had a known subchorionic hemorrhage earlier in pregnancy which she thought had resolved. Then bleeding started today, soaked clothing. Has a little cramping.    OB History    Gravida Para Term Preterm AB TAB SAB Ectopic Multiple Living        Past Medical History  Diagnosis Date  . PCOS (polycystic ovarian syndrome) 2012  . Vitamin D deficiency   . Gallstones 2012  . Depression     Past Surgical History  Procedure Laterality Date  . Wisdom tooth extraction  2003  . Cholecystectomy    . Mirena      Inserted 08-2007    Family History  Problem Relation Age of Onset  . Hypertension Father   . Heart disease Father     Social History  Substance Use Topics  . Smoking status: Never Smoker   . Smokeless tobacco: Never Used  . Alcohol Use: No    Allergies:  Allergies  Allergen Reactions   . Sulfa Antibiotics Hives    Happened when she was a child. Hives were all over the body  . Nutritional Supplements     bees    Prescriptions prior to admission  Medication Sig Dispense Refill Last Dose  . Cholecalciferol (VITAMIN D PO) Take by mouth.     Taking  . Cyanocobalamin (B-12 PO) Take by mouth daily.     Taking  . levonorgestrel (MIRENA) 20 MCG/24HR IUD 1 each by Intrauterine route once. Inserted 08-28-07.   Taking  . metroNIDAZOLE (FLAGYL) 500 MG tablet Take 1 tablet (500 mg total) by mouth 2 (two) times daily. For 7 days.  Avoid alcohol while taking 14 tablet 0   . Prenatal Vit-Fe Fumarate-FA (PRENATAL PO) Take by mouth.   Taking   Medical, Surgical, Family and Social histories reviewed and are listed above.  Medications and allergies reviewed.   Review of Systems  Constitutional: Negative for fever, chills and malaise/fatigue.  Gastrointestinal: Positive for abdominal pain. Negative for nausea, vomiting, diarrhea and constipation.  Genitourinary: Positive for vaginal bleeding and pelvic pain (slight cramping). Negative for urgency and flank pain.  Musculoskeletal: Negative for back pain.  Neurological: Negative for dizziness.  Other systems negative  Physical Exam   Blood pressure 134/68, pulse 84, temperature 98.1 F (36.7 C), temperature source Oral, resp. rate 18, height  (1.6 m), weight 242 lb (109.77 kg), last menstrual period 07/02/2014, SpO2 100 %.  Physical Exam  Constitutional: She is oriented to person, place, and time. She appears well-developed and well-nourished. No distress.  HENT:  Head: Normocephalic.  Cardiovascular: Normal rate and regular rhythm.   Respiratory: Effort normal. No respiratory distress.  GI: Soft. She exhibits no distension. There is no tenderness. There is no rebound and no guarding.  Genitourinary: Vaginal discharge found.  Speculum exam:  Small blood in vault Small clot at cervical os which is closed  Musculoskeletal:  Normal range of motion.  Neurological: She is alert and oriented to person, place, and time.  Skin: Skin is warm and dry.  Psychiatric: She has a normal mood and affect.   Fetal heart rate audible with doppler at 155  MAU Course  Procedures  MDM Bedside doppler obtained easily Consulted Dr Mindi Slicker re: presentation, exam findings and history. She recommends ultrasound Ultrasound ordered to evaluate for bleeding.  US Ob Comp Less 14 Wks  09/18/2014   CLINICAL DATA:  Vaginal bleeding.  EXAM: OBSTETRIC <14 WK ULTRASOUND  TECHNIQUE: Transabdominal ultrasound was performed for evaluation of the gestation as well as the maternal uterus and adnexal regions.  COMPARISON:  None.  FINDINGS: Intrauterine gestational sac: Single  Yolk sac:  No  Embryo:  Yes  Cardiac Activity: Yes  Heart Rate: 158 bpm  CRL:   48.5  mm   11 w 5 d                  [place] EDC: 04/04/2015  Maternal uterus/adnexae:  Subchorionic hemorrhage: Small subchorionic hemorrhage is identified within the lower uterine segment. This measures 2.9 cm.  Right ovary: Not visualized due to overlying bowel gas  Left ovary: Normal  Other :None  Free fluid:  None  IMPRESSION: 1. Single living intrauterine gestation. The estimated gestational age is 11 weeks and 5 days. 2. Small subchorionic hemorrhage.   Electronically Signed   By: Signa Kell M.D.   On: 09/18/2014 14:15    Assessment and Plan  A:  SIUP at [redacted]w[redacted]d        Vaginal bleeding in first trimester       Small subchorionic hemorrhage  P:  Consulted Dr Mindi Slicker with [place] results, she recommends pelvic rest and observation       Discharge home       Pelvic rest       Bleeding precautions       Follow up in office for prenatal visit  [facility] 09/18/2014, 1:16 PM

## 2014-09-18 NOTE — Discharge Instructions (Signed)

## 2015-03-15 LAB — OB RESULTS CONSOLE GBS: STREP GROUP B AG: POSITIVE

## 2015-04-01 ENCOUNTER — Telehealth (HOSPITAL_COMMUNITY): Payer: Self-pay | Admitting: *Deleted

## 2015-04-01 ENCOUNTER — Encounter (HOSPITAL_COMMUNITY): Payer: Self-pay | Admitting: *Deleted

## 2015-04-01 NOTE — Telephone Encounter (Signed)
Preadmission screen  

## 2015-04-05 NOTE — H&P (Signed)
[name] is a 30 y.o. female G2P0010 at  6239 5/7 weeks (EDD 04/08/15 by LMP c/w 6 week US)  presenting for IOL at term with favorable cervix.  Prenatal care uncomplicated except GBS positive and size for dates discrepancy but EFW 03/15/15 was 65%ile.    Maternal Medical History:  Contractions: Frequency: irregular.   Perceived severity is mild.    Fetal activity: Perceived fetal activity is normal.    Prenatal complications: No bleeding.     OB History    Gravida Para Term Preterm AB TAB SAB Ectopic Multiple Living   2 0 0 0 1 0 1 0 0 0     SAB x 1  Past Medical History  Diagnosis Date  . PCOS (polycystic ovarian syndrome) 2012  . Vitamin D deficiency   . Gallstones 2012  . Depression   . Hx of varicella    Past Surgical History  Procedure Laterality Date  . Wisdom tooth extraction  2003  . Cholecystectomy    . Mirena      Inserted 08-2007   Family History: family history includes Heart disease in her father; Hypertension in her father. Social History:  reports that she has never smoked. She has never used smokeless tobacco. She reports that she does not drink alcohol or use illicit drugs.   Prenatal Transfer Tool  Maternal Diabetes: No Genetic Screening: Declined Maternal Ultrasounds/Referrals: Normal Fetal Ultrasounds or other Referrals:  None Maternal Substance Abuse:  No Significant Maternal Medications:  None Significant Maternal Lab Results:  Lab values include: Group B Strep positive Other Comments:  None  Review of Systems  Gastrointestinal: Negative for abdominal pain.      Last menstrual period 07/02/2014. Maternal Exam:  Uterine Assessment: Contraction strength is mild.  Contraction frequency is irregular.   Abdomen: Fetal presentation: vertex  Introitus: Normal vulva. Normal vagina.    Physical Exam  Constitutional: She is oriented to person, place, and time. She appears well-developed and well-nourished.  Cardiovascular: Normal rate and  regular rhythm.   Respiratory: Effort normal.  GI: Soft.  Genitourinary: Vagina normal.  Neurological: She is alert and oriented to person, place, and time.  Psychiatric: She has a normal mood and affect.  Cervix 2/70/-2 /soft/anterior   Prenatal labs: ABO, Rh: O/Positive/-- (09/13 0000) Antibody: Negative (09/13 0000) Rubella: Immune (09/13 0000) RPR: Nonreactive (09/13 0000)  HBsAg: Negative (09/13 0000)  HIV: Non-reactive (09/13 0000)  GBS: Positive (03/13 0000)  CF negative One hour GCT 117  Assessment/Plan: Pt for IOL with cervix favorable.  PLan AROM and pitocin after PCN on board.     Oliver PilaRICHARDSON,Charlyn Vialpando W 04/05/2015, 4:50 PM

## 2015-04-06 ENCOUNTER — Inpatient Hospital Stay (HOSPITAL_COMMUNITY): Payer: BLUE CROSS/BLUE SHIELD | Admitting: Anesthesiology

## 2015-04-06 ENCOUNTER — Inpatient Hospital Stay (HOSPITAL_COMMUNITY)
Admission: RE | Admit: 2015-04-06 | Discharge: 2015-04-09 | DRG: 766 | Disposition: A | Discharge status: Home / Self Care | Payer: BLUE CROSS/BLUE SHIELD | Source: Ambulatory Visit | Attending: Obstetrics and Gynecology | Admitting: Obstetrics and Gynecology

## 2015-04-06 ENCOUNTER — Encounter (HOSPITAL_COMMUNITY): Payer: Self-pay

## 2015-04-06 DIAGNOSIS — Z3A39 39 weeks gestation of pregnancy: Secondary | ICD-10-CM

## 2015-04-06 DIAGNOSIS — O99824 Streptococcus B carrier state complicating childbirth: Secondary | ICD-10-CM | POA: Diagnosis present

## 2015-04-06 DIAGNOSIS — Z8249 Family history of ischemic heart disease and other diseases of the circulatory system: Secondary | ICD-10-CM

## 2015-04-06 DIAGNOSIS — O26893 Other specified pregnancy related conditions, third trimester: Secondary | ICD-10-CM | POA: Diagnosis present

## 2015-04-06 LAB — CBC
HCT: 36 % (ref 36.0–46.0)
HEMOGLOBIN: 12.7 g/dL (ref 12.0–15.0)
MCH: 29.3 pg (ref 26.0–34.0)
MCHC: 35.3 g/dL (ref 30.0–36.0)
MCV: 83.1 fL (ref 78.0–100.0)
PLATELETS: 236 10*3/uL (ref 150–400)
RBC: 4.33 MIL/uL (ref 3.87–5.11)
RDW: 13.6 % (ref 11.5–15.5)
WBC: 12.5 10*3/uL — ABNORMAL HIGH (ref 4.0–10.5)

## 2015-04-06 LAB — ABO/RH: ABO/RH(D): O POS

## 2015-04-06 LAB — TYPE AND SCREEN
ABO/RH(D): O POS
Antibody Screen: NEGATIVE

## 2015-04-06 LAB — RPR: RPR Ser Ql: NONREACTIVE

## 2015-04-06 MED ORDER — LACTATED RINGERS IV SOLN
INTRAVENOUS | Status: DC
  Administered 2015-04-06 (×4): via INTRAVENOUS

## 2015-04-06 MED ORDER — LACTATED RINGERS IV SOLN
1.0000 m[IU]/min | INTRAVENOUS | Status: DC
  Administered 2015-04-06: 2 m[IU]/min via INTRAVENOUS
  Administered 2015-04-06: 12 m[IU]/min via INTRAVENOUS
  Filled 2015-04-06 (×2): qty 4

## 2015-04-06 MED ORDER — FENTANYL 2.5 MCG/ML BUPIVACAINE 1/10 % EPIDURAL INFUSION (WH - ANES)
14.0000 mL/h | INTRAMUSCULAR | Status: DC | PRN
  Administered 2015-04-06: 13 mL/h via EPIDURAL
  Filled 2015-04-06 (×2): qty 125

## 2015-04-06 MED ORDER — PENICILLIN G POTASSIUM 5000000 UNITS IJ SOLR
2.5000 10*6.[IU] | INTRAMUSCULAR | Status: DC
  Administered 2015-04-06 (×4): 2.5 10*6.[IU] via INTRAVENOUS
  Filled 2015-04-06 (×7): qty 2.5

## 2015-04-06 MED ORDER — OXYCODONE-ACETAMINOPHEN 5-325 MG PO TABS: 2.0000 | ORAL_TABLET | ORAL | Status: DC | PRN

## 2015-04-06 MED ORDER — LIDOCAINE HCL (PF) 1 % IJ SOLN
INTRAMUSCULAR | Status: DC | PRN
  Administered 2015-04-06 (×2): 4 mL via EPIDURAL

## 2015-04-06 MED ORDER — TERBUTALINE SULFATE 1 MG/ML IJ SOLN: 0.2500 mg | Freq: Once | INTRAMUSCULAR | Status: DC | PRN

## 2015-04-06 MED ORDER — ACETAMINOPHEN 325 MG PO TABS: 650.0000 mg | ORAL_TABLET | ORAL | Status: DC | PRN

## 2015-04-06 MED ORDER — OXYTOCIN 10 UNIT/ML IJ SOLN: 2.5000 [IU]/h | INTRAMUSCULAR | Status: DC

## 2015-04-06 MED ORDER — DIPHENHYDRAMINE HCL 50 MG/ML IJ SOLN: 12.5000 mg | INTRAMUSCULAR | Status: DC | PRN

## 2015-04-06 MED ORDER — LIDOCAINE HCL (PF) 1 % IJ SOLN: 30.0000 mL | INTRAMUSCULAR | Status: DC | PRN

## 2015-04-06 MED ORDER — OXYCODONE-ACETAMINOPHEN 5-325 MG PO TABS: 1.0000 | ORAL_TABLET | ORAL | Status: DC | PRN

## 2015-04-06 MED ORDER — PHENYLEPHRINE 40 MCG/ML (10ML) SYRINGE FOR IV PUSH (FOR BLOOD PRESSURE SUPPORT)
80.0000 ug | PREFILLED_SYRINGE | INTRAVENOUS | Status: DC | PRN
  Filled 2015-04-06: qty 20

## 2015-04-06 MED ORDER — LACTATED RINGERS IV SOLN
500.0000 mL | Freq: Once | INTRAVENOUS | Status: AC
  Administered 2015-04-06: 500 mL via INTRAVENOUS

## 2015-04-06 MED ORDER — EPHEDRINE 5 MG/ML INJ: 10.0000 mg | INTRAVENOUS | Status: DC | PRN

## 2015-04-06 MED ORDER — LACTATED RINGERS IV SOLN: 500.0000 mL | INTRAVENOUS | Status: DC | PRN

## 2015-04-06 MED ORDER — ONDANSETRON HCL 4 MG/2ML IJ SOLN: 4.0000 mg | Freq: Four times a day (QID) | INTRAMUSCULAR | Status: DC | PRN

## 2015-04-06 MED ORDER — OXYTOCIN BOLUS FROM INFUSION: 500.0000 mL | INTRAVENOUS | Status: DC

## 2015-04-06 MED ORDER — BUTORPHANOL TARTRATE 1 MG/ML IJ SOLN: 1.0000 mg | INTRAMUSCULAR | Status: DC | PRN

## 2015-04-06 MED ORDER — CITRIC ACID-SODIUM CITRATE 334-500 MG/5ML PO SOLN
30.0000 mL | ORAL | Status: DC | PRN
  Administered 2015-04-07: 30 mL via ORAL
  Filled 2015-04-06 (×2): qty 15

## 2015-04-06 MED ORDER — PHENYLEPHRINE 40 MCG/ML (10ML) SYRINGE FOR IV PUSH (FOR BLOOD PRESSURE SUPPORT): 80.0000 ug | PREFILLED_SYRINGE | INTRAVENOUS | Status: DC | PRN

## 2015-04-06 MED ORDER — PENICILLIN G POTASSIUM 5000000 UNITS IJ SOLR
5.0000 10*6.[IU] | Freq: Once | INTRAVENOUS | Status: AC
  Administered 2015-04-06: 5 10*6.[IU] via INTRAVENOUS
  Filled 2015-04-06: qty 5

## 2015-04-06 NOTE — Progress Notes (Signed)
Patient ID: [name], female   DOB: 04-04-1985, 30 y.o.   MRN: [id number] Pt has been comfortable with epidural Slow to get adequate with increasing pitocin  Afeb vss FHR category 1  Cervix 90/4+/-1  IUPC has not demonstrated adequate contractions despite increasing pitocin to 30 mu Will rest uterus about 20 minutes and change pitocin bag, restart at 12mu and see if we can increase amplitude of contractions D/w pt latent phase labor PCN for +GBS

## 2015-04-06 NOTE — Progress Notes (Signed)
Patient ID: [name], female   DOB: 10/26/1985, 30 y.o.   MRN: [id number] Pt comfortable with epidural  Cervix 90/4-5/-1  FHR Category 1 Pitocin back at 18 mu after a brief rest and reduction of dose, still not adequate  Continue to increase pitocin up to 30mu, placed in exaggerated Sims, vertex has just not descended much EFW 81/2-9#  Will follow progress

## 2015-04-06 NOTE — Progress Notes (Signed)
Patient ID: [name], female   DOB: 05-20-1985, 30 y.o.   MRN: [id number] Pt admitted and PCN on board for +GBS, feeling mild ctx with pitocin  afeb vss FHR category 1  Cervix 80/2/-2 AROM clear  Continue pitocin and follow progress Epidural prn

## 2015-04-06 NOTE — Progress Notes (Signed)
Patient ID: [name], female   DOB: [birth year], 30 y.o.   MRN: [id number] Pt comfortable with epidural afeb vss FHR Category 1 Cervix 3/80/-1  IUPC placed to adjust pitocin Pitocin at 12 mu Follow progress

## 2015-04-06 NOTE — Anesthesia Preprocedure Evaluation (Signed)
Anesthesia Evaluation  Patient identified by MRN, date of birth, ID band Patient awake    Reviewed: Allergy & Precautions, Patient's Chart, lab work & pertinent test results  Airway Mallampati: III  TM Distance: >3 FB Neck ROM: Full    Dental no notable dental hx. (+) Teeth Intact   Pulmonary neg pulmonary ROS,    Pulmonary exam normal breath sounds clear to auscultation       Cardiovascular negative cardio ROS Normal cardiovascular exam Rhythm:Regular Rate:Normal     Neuro/Psych PSYCHIATRIC DISORDERS Depression negative neurological ROS     GI/Hepatic Neg liver ROS, GERD  Medicated and Controlled,  Endo/Other  Morbid obesityPCOS  Renal/GU negative Renal ROS  negative genitourinary   Musculoskeletal negative musculoskeletal ROS (+)   Abdominal (+) + obese,   Peds  Hematology   Anesthesia Other Findings   Reproductive/Obstetrics (+) Pregnancy                             Anesthesia Physical Anesthesia Plan  ASA: III  Anesthesia Plan: Epidural   Post-op Pain Management:    Induction:   Airway Management Planned: Natural Airway  Additional Equipment:   Intra-op Plan:   Post-operative Plan:   Informed Consent: I have reviewed the patients History and Physical, chart, labs and discussed the procedure including the risks, benefits and alternatives for the proposed anesthesia with the patient or authorized representative who has indicated his/her understanding and acceptance.     Plan Discussed with: Anesthesiologist  Anesthesia Plan Comments:         Anesthesia Quick Evaluation

## 2015-04-06 NOTE — Anesthesia Procedure Notes (Signed)
Epidural Patient location during procedure: OB Start time: 04/06/2015 12:19 PM  Staffing Anesthesiologist: Mal AmabileFOSTER, Cavan Bearden Performed by: anesthesiologist   Preanesthetic Checklist Completed: patient identified, site marked, surgical consent, pre-op evaluation, timeout performed, IV checked, risks and benefits discussed and monitors and equipment checked  Epidural Patient position: sitting Prep: site prepped and draped and DuraPrep Patient monitoring: continuous pulse ox and blood pressure Approach: midline Location: L3-L4 Injection technique: LOR air  Needle:  Needle type: Tuohy  Needle gauge: 17 G Needle length: 9 cm and 9 Needle insertion depth: 6 cm Catheter type: closed end flexible Catheter size: 19 Gauge Catheter at skin depth: 12 cm Test dose: negative and Other  Assessment Events: blood not aspirated, injection not painful, no injection resistance, negative IV test and no paresthesia  Additional Notes Patient identified. Risks and benefits discussed including failed block, incomplete  Pain control, post dural puncture headache, nerve damage, paralysis, blood pressure Changes, nausea, vomiting, reactions to medications-both toxic and allergic and post Partum back pain. All questions were answered. Patient expressed understanding and wished to proceed. Sterile technique was used throughout procedure. Epidural site was Dressed with sterile barrier dressing. No paresthesias, signs of intravascular injection Or signs of intrathecal spread were encountered.  Patient was more comfortable after the epidural was dosed. Please see RN's note for documentation of vital signs and FHR which are stable.

## 2015-04-06 NOTE — Consults (Signed)
  Anesthesia Pain Consult Note  Patient: [name], 30 y.o., female  Consult Requested by: Huel CoteKathy Richardson, MD  Reason for Consult: CRNA Pain Rounds  Level of Consciousness: alert  Pain: 1 /10 Pain Goal: 5  Last Vitals:  Filed Vitals:   04/06/15 0812 04/06/15 0903  BP: 130/73 141/87  Pulse: 74 82  Temp: 36.9 C   Resp: 18 18    Plan: Epidural infusion for pain control.   Malone Vanblarcom 04/06/2015

## 2015-04-07 ENCOUNTER — Encounter (HOSPITAL_COMMUNITY)
Admission: RE | Disposition: A | Discharge status: Home / Self Care | Payer: Self-pay | Source: Ambulatory Visit | Attending: Obstetrics and Gynecology

## 2015-04-07 ENCOUNTER — Inpatient Hospital Stay (HOSPITAL_COMMUNITY): Admission: AD | Admit: 2015-04-07 | Payer: Self-pay | Source: Ambulatory Visit | Admitting: Obstetrics and Gynecology

## 2015-04-07 ENCOUNTER — Encounter (HOSPITAL_COMMUNITY): Payer: Self-pay

## 2015-04-07 SURGERY — Surgical Case
Anesthesia: Epidural

## 2015-04-07 MED ORDER — SODIUM CHLORIDE 0.9% FLUSH: 3.0000 mL | INTRAVENOUS | Status: DC | PRN

## 2015-04-07 MED ORDER — DEXAMETHASONE SODIUM PHOSPHATE 4 MG/ML IJ SOLN
INTRAMUSCULAR | Status: DC | PRN
  Administered 2015-04-07: 4 mg via INTRAVENOUS

## 2015-04-07 MED ORDER — OXYCODONE HCL 5 MG PO TABS
5.0000 mg | ORAL_TABLET | ORAL | Status: DC | PRN
  Administered 2015-04-07 – 2015-04-09 (×2): 5 mg via ORAL
  Filled 2015-04-07 (×2): qty 1

## 2015-04-07 MED ORDER — OXYTOCIN 10 UNIT/ML IJ SOLN
INTRAMUSCULAR | Status: AC
  Filled 2015-04-07: qty 4

## 2015-04-07 MED ORDER — FENTANYL CITRATE (PF) 100 MCG/2ML IJ SOLN
INTRAMUSCULAR | Status: AC
  Filled 2015-04-07: qty 2

## 2015-04-07 MED ORDER — OXYTOCIN 10 UNIT/ML IJ SOLN
40.0000 [IU] | INTRAVENOUS | Status: DC | PRN
  Administered 2015-04-07: 40 [IU] via INTRAVENOUS

## 2015-04-07 MED ORDER — DIBUCAINE 1 % RE OINT: 1.0000 "application " | TOPICAL_OINTMENT | RECTAL | Status: DC | PRN

## 2015-04-07 MED ORDER — LACTATED RINGERS IV SOLN
INTRAVENOUS | Status: DC | PRN
  Administered 2015-04-07: 05:00:00 via INTRAVENOUS

## 2015-04-07 MED ORDER — TETANUS-DIPHTH-ACELL PERTUSSIS 5-2.5-18.5 LF-MCG/0.5 IM SUSP: 0.5000 mL | Freq: Once | INTRAMUSCULAR | Status: DC

## 2015-04-07 MED ORDER — IBUPROFEN 600 MG PO TABS
600.0000 mg | ORAL_TABLET | Freq: Four times a day (QID) | ORAL | Status: DC
  Administered 2015-04-07 – 2015-04-09 (×8): 600 mg via ORAL
  Filled 2015-04-07 (×8): qty 1

## 2015-04-07 MED ORDER — LANOLIN HYDROUS EX OINT: 1.0000 "application " | TOPICAL_OINTMENT | CUTANEOUS | Status: DC | PRN

## 2015-04-07 MED ORDER — MORPHINE SULFATE (PF) 0.5 MG/ML IJ SOLN
INTRAMUSCULAR | Status: AC
  Filled 2015-04-07: qty 10

## 2015-04-07 MED ORDER — KETOROLAC TROMETHAMINE 30 MG/ML IJ SOLN: 30.0000 mg | Freq: Four times a day (QID) | INTRAMUSCULAR | Status: AC | PRN

## 2015-04-07 MED ORDER — NALOXONE HCL 2 MG/2ML IJ SOSY: 1.0000 ug/kg/h | PREFILLED_SYRINGE | INTRAVENOUS | Status: DC | PRN

## 2015-04-07 MED ORDER — LACTATED RINGERS IV SOLN
INTRAVENOUS | Status: DC
  Administered 2015-04-07 – 2015-04-08 (×3): via INTRAVENOUS

## 2015-04-07 MED ORDER — ACETAMINOPHEN 325 MG PO TABS
650.0000 mg | ORAL_TABLET | ORAL | Status: DC | PRN
  Administered 2015-04-09: 650 mg via ORAL
  Filled 2015-04-07: qty 2

## 2015-04-07 MED ORDER — CEFAZOLIN SODIUM-DEXTROSE 2-3 GM-% IV SOLR
INTRAVENOUS | Status: DC | PRN
  Administered 2015-04-07: 2 g via INTRAVENOUS

## 2015-04-07 MED ORDER — NALBUPHINE HCL 10 MG/ML IJ SOLN: 5.0000 mg | Freq: Once | INTRAMUSCULAR | Status: DC | PRN

## 2015-04-07 MED ORDER — PRENATAL MULTIVITAMIN CH
1.0000 | ORAL_TABLET | Freq: Every day | ORAL | Status: DC
  Administered 2015-04-07 – 2015-04-09 (×3): 1 via ORAL
  Filled 2015-04-07 (×3): qty 1

## 2015-04-07 MED ORDER — WITCH HAZEL-GLYCERIN EX PADS: 1.0000 "application " | MEDICATED_PAD | CUTANEOUS | Status: DC | PRN

## 2015-04-07 MED ORDER — CEFAZOLIN SODIUM-DEXTROSE 2-4 GM/100ML-% IV SOLN: 2.0000 g | Freq: Once | INTRAVENOUS | Status: DC

## 2015-04-07 MED ORDER — SODIUM CHLORIDE 0.9 % IV SOLN
10000.0000 ug | INTRAVENOUS | Status: DC | PRN
  Administered 2015-04-07: 40 ug via INTRAVENOUS
  Administered 2015-04-07 (×2): 80 ug via INTRAVENOUS

## 2015-04-07 MED ORDER — SCOPOLAMINE 1 MG/3DAYS TD PT72: 1.0000 | MEDICATED_PATCH | Freq: Once | TRANSDERMAL | Status: DC

## 2015-04-07 MED ORDER — OXYCODONE HCL 5 MG PO TABS: 10.0000 mg | ORAL_TABLET | ORAL | Status: DC | PRN

## 2015-04-07 MED ORDER — FENTANYL CITRATE (PF) 100 MCG/2ML IJ SOLN
INTRAMUSCULAR | Status: DC | PRN
  Administered 2015-04-07: 100 ug via EPIDURAL

## 2015-04-07 MED ORDER — SENNOSIDES-DOCUSATE SODIUM 8.6-50 MG PO TABS
2.0000 | ORAL_TABLET | ORAL | Status: DC
  Administered 2015-04-07 – 2015-04-09 (×2): 2 via ORAL
  Filled 2015-04-07 (×2): qty 2

## 2015-04-07 MED ORDER — SCOPOLAMINE 1 MG/3DAYS TD PT72
MEDICATED_PATCH | TRANSDERMAL | Status: AC
  Filled 2015-04-07: qty 1

## 2015-04-07 MED ORDER — SIMETHICONE 80 MG PO CHEW
80.0000 mg | CHEWABLE_TABLET | Freq: Three times a day (TID) | ORAL | Status: DC
  Administered 2015-04-07 – 2015-04-08 (×4): 80 mg via ORAL
  Filled 2015-04-07 (×5): qty 1

## 2015-04-07 MED ORDER — MORPHINE SULFATE (PF) 0.5 MG/ML IJ SOLN
INTRAMUSCULAR | Status: DC | PRN
  Administered 2015-04-07: 4 mg via EPIDURAL

## 2015-04-07 MED ORDER — ZOLPIDEM TARTRATE 5 MG PO TABS: 5.0000 mg | ORAL_TABLET | Freq: Every evening | ORAL | Status: DC | PRN

## 2015-04-07 MED ORDER — SODIUM BICARBONATE 8.4 % IV SOLN
INTRAVENOUS | Status: DC | PRN
  Administered 2015-04-07 (×3): 5 mL via EPIDURAL

## 2015-04-07 MED ORDER — MENTHOL 3 MG MT LOZG: 1.0000 | LOZENGE | OROMUCOSAL | Status: DC | PRN

## 2015-04-07 MED ORDER — DIPHENHYDRAMINE HCL 25 MG PO CAPS: 25.0000 mg | ORAL_CAPSULE | Freq: Four times a day (QID) | ORAL | Status: DC | PRN

## 2015-04-07 MED ORDER — NALBUPHINE HCL 10 MG/ML IJ SOLN
5.0000 mg | INTRAMUSCULAR | Status: DC | PRN
Start: 2015-04-07 — End: 2015-04-09

## 2015-04-07 MED ORDER — NALBUPHINE HCL 10 MG/ML IJ SOLN: 5.0000 mg | INTRAMUSCULAR | Status: DC | PRN

## 2015-04-07 MED ORDER — LACTATED RINGERS IV SOLN: 2.5000 [IU]/h | INTRAVENOUS | Status: AC

## 2015-04-07 MED ORDER — SIMETHICONE 80 MG PO CHEW
80.0000 mg | CHEWABLE_TABLET | ORAL | Status: DC
  Administered 2015-04-07 – 2015-04-09 (×2): 80 mg via ORAL
  Filled 2015-04-07 (×2): qty 1

## 2015-04-07 MED ORDER — ONDANSETRON HCL 4 MG/2ML IJ SOLN
INTRAMUSCULAR | Status: AC
  Filled 2015-04-07: qty 2

## 2015-04-07 MED ORDER — ONDANSETRON HCL 4 MG/2ML IJ SOLN
INTRAMUSCULAR | Status: DC | PRN
  Administered 2015-04-07: 4 mg via INTRAVENOUS

## 2015-04-07 MED ORDER — SCOPOLAMINE 1 MG/3DAYS TD PT72
MEDICATED_PATCH | TRANSDERMAL | Status: DC | PRN
  Administered 2015-04-07: 1 via TRANSDERMAL

## 2015-04-07 MED ORDER — DEXAMETHASONE SODIUM PHOSPHATE 4 MG/ML IJ SOLN
INTRAMUSCULAR | Status: AC
  Filled 2015-04-07: qty 1

## 2015-04-07 MED ORDER — LORATADINE 10 MG PO TABS
10.0000 mg | ORAL_TABLET | Freq: Every day | ORAL | Status: DC
  Filled 2015-04-07: qty 1

## 2015-04-07 MED ORDER — ONDANSETRON HCL 4 MG/2ML IJ SOLN
4.0000 mg | Freq: Three times a day (TID) | INTRAMUSCULAR | Status: DC | PRN
  Administered 2015-04-07: 4 mg via INTRAVENOUS
  Filled 2015-04-07: qty 2

## 2015-04-07 MED ORDER — DIPHENHYDRAMINE HCL 50 MG/ML IJ SOLN: 12.5000 mg | INTRAMUSCULAR | Status: DC | PRN

## 2015-04-07 MED ORDER — HYDROMORPHONE HCL 1 MG/ML IJ SOLN: 0.2500 mg | INTRAMUSCULAR | Status: DC | PRN

## 2015-04-07 MED ORDER — NALOXONE HCL 0.4 MG/ML IJ SOLN: 0.4000 mg | INTRAMUSCULAR | Status: DC | PRN

## 2015-04-07 MED ORDER — SIMETHICONE 80 MG PO CHEW: 80.0000 mg | CHEWABLE_TABLET | ORAL | Status: DC | PRN

## 2015-04-07 MED ORDER — MEPERIDINE HCL 25 MG/ML IJ SOLN: 6.2500 mg | INTRAMUSCULAR | Status: DC | PRN

## 2015-04-07 MED ORDER — KETOROLAC TROMETHAMINE 30 MG/ML IJ SOLN
30.0000 mg | Freq: Four times a day (QID) | INTRAMUSCULAR | Status: AC | PRN
Start: 2015-04-07 — End: 2015-04-08

## 2015-04-07 MED ORDER — DIPHENHYDRAMINE HCL 25 MG PO CAPS: 25.0000 mg | ORAL_CAPSULE | ORAL | Status: DC | PRN

## 2015-04-07 SURGICAL SUPPLY — 37 items
APL SKNCLS STERI-STRIP NONHPOA (GAUZE/BANDAGES/DRESSINGS) ×1
BENZOIN TINCTURE PRP APPL 2/3 (GAUZE/BANDAGES/DRESSINGS) ×2 IMPLANT
CHLORAPREP W/TINT 26ML (MISCELLANEOUS) ×3 IMPLANT
CLAMP CORD UMBIL (MISCELLANEOUS) IMPLANT
CLOSURE STERI STRIP 1/2 X4 (GAUZE/BANDAGES/DRESSINGS) ×2 IMPLANT
CLOSURE WOUND 1/2 X4 (GAUZE/BANDAGES/DRESSINGS)
CLOTH BEACON ORANGE TIMEOUT ST (SAFETY) ×3 IMPLANT
DRSG OPSITE POSTOP 4X10 (GAUZE/BANDAGES/DRESSINGS) ×3 IMPLANT
ELECT REM PT RETURN 9FT ADLT (ELECTROSURGICAL) ×3
ELECTRODE REM PT RTRN 9FT ADLT (ELECTROSURGICAL) ×1 IMPLANT
EXTRACTOR VACUUM KIWI (MISCELLANEOUS) IMPLANT
GLOVE BIO SURGEON STRL SZ 6.5 (GLOVE) ×2 IMPLANT
GLOVE BIO SURGEONS STRL SZ 6.5 (GLOVE) ×1
GLOVE BIOGEL PI IND STRL 7.0 (GLOVE) ×2 IMPLANT
GLOVE BIOGEL PI INDICATOR 7.0 (GLOVE) ×4
GOWN STRL REUS W/TWL LRG LVL3 (GOWN DISPOSABLE) ×6 IMPLANT
KIT ABG SYR 3ML LUER SLIP (SYRINGE) IMPLANT
NDL HYPO 25X5/8 SAFETYGLIDE (NEEDLE) IMPLANT
NEEDLE HYPO 25X5/8 SAFETYGLIDE (NEEDLE) IMPLANT
NS IRRIG 1000ML POUR BTL (IV SOLUTION) ×3 IMPLANT
PACK C SECTION WH (CUSTOM PROCEDURE TRAY) ×3 IMPLANT
PAD OB MATERNITY 4.3X12.25 (PERSONAL CARE ITEMS) ×3 IMPLANT
PENCIL SMOKE EVAC W/HOLSTER (ELECTROSURGICAL) ×3 IMPLANT
RTRCTR C-SECT PINK 25CM LRG (MISCELLANEOUS) ×3 IMPLANT
STRIP CLOSURE SKIN 1/2X4 (GAUZE/BANDAGES/DRESSINGS) IMPLANT
SUT CHROMIC 1 CTX 36 (SUTURE) ×8 IMPLANT
SUT PLAIN 0 NONE (SUTURE) IMPLANT
SUT PLAIN 2 0 XLH (SUTURE) ×5 IMPLANT
SUT VIC AB 0 CT1 27 (SUTURE) ×6
SUT VIC AB 0 CT1 27XBRD ANBCTR (SUTURE) ×2 IMPLANT
SUT VIC AB 2-0 CT1 27 (SUTURE) ×3
SUT VIC AB 2-0 CT1 TAPERPNT 27 (SUTURE) ×1 IMPLANT
SUT VIC AB 3-0 CT1 27 (SUTURE)
SUT VIC AB 3-0 CT1 TAPERPNT 27 (SUTURE) IMPLANT
SUT VIC AB 4-0 KS 27 (SUTURE) ×3 IMPLANT
TOWEL OR 17X24 6PK STRL BLUE (TOWEL DISPOSABLE) ×3 IMPLANT
TRAY FOLEY CATH SILVER 14FR (SET/KITS/TRAYS/PACK) ×3 IMPLANT

## 2015-04-07 NOTE — Progress Notes (Signed)
Patient ID: [name], female   DOB: 08/16/1985, 30 y.o.   MRN: [id number] Pt has been comfortable.  Able to get MVU's adequate after adjusting pitocin but has made no change in cervix in over 6 hours and vertex not descending at all  afeb vss FHR category 1  Cervix 90/5/-1  D/w pt options given lack of progress despite adequate labor.  She would like to proceed with c-section.  Risks and benefits reviewed including risk of bleeding, infection, possible damage to bowel and bladder.  She understands and desires to proceed.    OR notified and finishing a prior c-section then will go back Ancef for prophylaxis ordered.

## 2015-04-07 NOTE — Lactation Note (Signed)
This note was copied from a baby's chart. Lactation Consultation Note; Baby now 8 hours old and has had a couple of feedings. Mom reports he fed for 10 min about 15 min before I went into room. Still rooting on dad's arms. Offered assist with latch and mom agreeable. Assisted mom in football hold and he latched well for 20 min on left breast. Still rotting, so latched to left breast for 5 min then off to sleep. Reviewed feeding cues and encouraged to feed whenever she sees them Mom states she had planned to bottle feed formula but after a class decided she would give it a try. 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 Reviewed gradually weaning baby when ready to stop breast feeding. BF brochure given with resources for support after DC. Reviewed OP appointments and BFSG . Encouraged to read Baby and Me book. Mom getting very sleepy. No questions at present. To call for assist prn  Patient Name: [name] [id number]'s Date: 04/07/2015 Reason for consult: Initial assessment   Maternal Data Formula Feeding for Exclusion: Yes Reason for exclusion: Mother's choice to formula and breast feed on admission Has patient been taught Hand Expression?: Yes Does the patient have breastfeeding experience prior to this delivery?: No  Feeding Feeding Type: Breast Fed Length of feed: 25 min  LATCH Score/Interventions Latch: Grasps breast easily, tongue down, lips flanged, rhythmical sucking.  Audible Swallowing: A few with stimulation  Type of Nipple: Everted at rest and after stimulation  Comfort (Breast/Nipple): Soft / non-tender     Hold (Positioning): Assistance needed to correctly position infant at breast and maintain latch. Intervention(s): Breastfeeding basics reviewed;Position options;Skin to skin  LATCH Score: 8  Lactation Tools Discussed/Used     Consult Status Consult Status: Follow-up Date: 04/08/15 Follow-up type:  In-patient    [name], [name] 04/07/2015, 2:00 PM

## 2015-04-07 NOTE — Anesthesia Postprocedure Evaluation (Signed)
Anesthesia Post Note  Patient: Tylene FantasiaMegan M Boehlke  Procedure(s) Performed: Procedure(s) (LRB): CESAREAN SECTION (N/A)  Patient location during evaluation: Mother Baby Anesthesia Type: Epidural Level of consciousness: awake, awake and alert, oriented and patient cooperative Pain management: pain level controlled Vital Signs Assessment: post-procedure vital signs reviewed and stable Respiratory status: spontaneous breathing, nonlabored ventilation and respiratory function stable Cardiovascular status: stable Postop Assessment: no headache, no backache, patient able to bend at knees and no signs of nausea or vomiting Anesthetic complications: no    Last Vitals:  Filed Vitals:   04/07/15 0930 04/07/15 1030  BP: 119/76 114/59  Pulse: 60 65  Temp: 36.9 C 37.1 C  Resp: 16 16    Last Pain:  Filed Vitals:   04/07/15 1104  PainSc: 0-No pain                 Assad Harbeson Hristova

## 2015-04-07 NOTE — Progress Notes (Signed)
MOB was referred for history of depression/anxiety.  Referral is screened out by Clinical Social Worker because none of the following criteria appear to apply: -History of anxiety/depression during this pregnancy, or of post-partum depression. - Diagnosis of anxiety and/or depression within last 3 years or -MOB's symptoms are currently being treated with medication and/or therapy.  Please contact the Clinical Social Worker if needs arise or upon MOB request.  

## 2015-04-07 NOTE — Op Note (Signed)
Operative Note    Preoperative Diagnosis Arrest of Dilation Term Pregnancy at 39 6/7 weeks  Postoperative Diagnosis Same  Procedure Primary low transverse c-section with 2 layer closure of uterus  Surgeon Huel CoteKathy Madisson Kulaga  Anesthesia Epidural  Fluids: EBL  800cc UOP  150cc urine IVF 2200cc LR  Findings Viable female infant in the vertex presentation.  Nuchal cord x 1 loose.  Apgars 8,9  Weight pending.  Normal uterus, tubes and ovaries  Specimen Placenta to L&D  Procedure Note   Patient was taken to the operating room where epidural anesthesia was found to be adequate by Allis clamp test. She was prepped and draped in the normal sterile fashion in the dorsal supine position with a leftward tilt. An appropriate time out was performed. A Pfannenstiel skin incision was then made with the scalpel and carried through to the underlying layer of fascia by sharp dissection and Bovie cautery. The fascia was nicked in the midline and the incision was extended laterally with Mayo scissors. The inferior aspect of the incision was grasped Coker clamps and dissected off the underlying rectus muscles. In a similar fashion the superior aspect was dissected off the rectus muscles. Rectus muscles were separated in the midline and the peritoneal cavity entered bluntly. The peritoneal incision was then extended both superiorly and inferiorly with careful attention to avoid both bowel and bladder. The Alexis self-retaining wound retractor was then placed within the incision and the lower uterine segment exposed. The bladder flap was developed with Metzenbaum scissors and pushed away from the lower uterine segment. The lower uterine segment was then incised in a transverse fashion and the cavity itself entered bluntly. The incision was extended bluntly. The infant's head was then lifted and delivered from the incision without difficulty. The remainder of the infant delivered and the nose and mouth bulb  suctioned with the cord clamped and cut as well after a delay of one minute. The infant was handed off to the waiting pediatricians. The placenta was then spontaneously expressed from the uterus and the uterus cleared of all clots and debris with moist lap sponge. The uterine incision was then repaired in 2 layers the first layer was a running locked layer 1-0 chromic and the second an imbricating layer of the same suture. The tubes and ovaries were inspected and the gutters cleared of all clots and debris. The uterine incision was inspected and found to be hemostatic. All instruments and sponges as well as the Alexis retractor were then removed from the abdomen. The rectus muscles and peritoneum were then reapproximated with several interrupted mattress sutures of 2-0 Vicryl. The fascia was then closed with 0 Vicryl in a running fashion. Subcutaneous tissue was reapproximated with 3-0 plain in a running fashion. The skin was closed with a subcuticular stitch of 4-0 Vicryl on a Keith needle and then reinforced with benzoin and Steri-Strips. At the conclusion of the procedure all instruments and sponge counts were correct. Patient was taken to the recovery room in good condition with her baby accompanying her skin to skin.

## 2015-04-07 NOTE — Transfer of Care (Signed)
Immediate Anesthesia Transfer of Care Note  Patient: Christine FantasiaMegan M Maddox  Procedure(s) Performed: Procedure(s): CESAREAN SECTION (N/A)  Patient Location: PACU  Anesthesia Type:Epidural  Level of Consciousness: awake, alert  and oriented  Airway & Oxygen Therapy: Patient Spontanous Breathing  Post-op Assessment: Report given to RN and Post -op Vital signs reviewed and stable  Post vital signs: Reviewed and stable  Last Vitals:  Filed Vitals:   04/07/15 0401 04/07/15 0554  BP: 123/56 93/43  Pulse: 92 35  Temp:    Resp:  10    Complications: No apparent anesthesia complications

## 2015-04-07 NOTE — Lactation Note (Signed)
This note was copied from a baby's chart. Lactation Consultation Note; Mom called to assist, her friend had helped her latch baby. Several good sucks noted while I was in the room and mom reports no pain with latch, only tugging. Baby off to sleep after 5 min. Left on mom's chest. Encouraged to watch for feeding cues and feed whenever she sees them. To call for assist prn  Patient Name: Christine Nelson [name] [name]: 04/07/2015 Reason for consult: Initial assessment   Maternal Data Formula Feeding for Exclusion: Yes Reason for exclusion: Mother's choice to formula and breast feed on admission Has patient been taught Hand Expression?: Yes Does the patient have breastfeeding experience prior to this delivery?: No  Feeding Feeding Type: Breast Fed Length of feed: 25 min  LATCH Score/Interventions Latch: Grasps breast easily, tongue down, lips flanged, rhythmical sucking.  Audible Swallowing: A few with stimulation  Type of Nipple: Everted at rest and after stimulation  Comfort (Breast/Nipple): Soft / non-tender     Hold (Positioning): Assistance needed to correctly position infant at breast and maintain latch. Intervention(s): Breastfeeding basics reviewed;Position options;Skin to skin  LATCH Score: 8  Lactation Tools Discussed/Used     Consult Status Consult Status: Follow-up Date: 04/08/15 Follow-up type: In-patient    Pamelia HoitWeeks, Lafaye Mcelmurry D 04/07/2015, 3:51 PM

## 2015-04-07 NOTE — Consult Note (Signed)
[facility]  Delivery Note:  C-section       04/07/2015  4:55 AM  I was called to the operating room at the request of the patient's obstetrician (Dr. Senaida Oresichardson) for a primary c-section for failure to progress.  PRENATAL HX:  This is a 30 y/o G2P0010 at 7639 and 6/[redacted] weeks gestation who was admitted on 4/3 for IOL at term.  Her pregnancy has been uncomplicated except GBS positive.  She received adequate intrapartum treatment with AROM at 0911 on 4/4 (ROM x20 hours).  C-section for failure to progress.    DELIVERY:  Loose nuchal cord.  Infant was vigorous at delivery, requiring no resuscitation other than standard warming, drying and stimulation.  APGARs 8 and 9.  Exam notable for caput and molding, otherwise within normal limits.  After 5 minutes, baby left with nurse to assist parents with skin-to-skin care.   _____________________ Electronically Signed By: Maryan CharLindsey Aalayah Riles, MD Neonatologist

## 2015-04-08 ENCOUNTER — Encounter (HOSPITAL_COMMUNITY): Payer: Self-pay | Admitting: Obstetrics and Gynecology

## 2015-04-08 LAB — CBC
HEMATOCRIT: 31 % — AB (ref 36.0–46.0)
Hemoglobin: 10.8 g/dL — ABNORMAL LOW (ref 12.0–15.0)
MCH: 29.3 pg (ref 26.0–34.0)
MCHC: 34.8 g/dL (ref 30.0–36.0)
MCV: 84.2 fL (ref 78.0–100.0)
PLATELETS: 203 10*3/uL (ref 150–400)
RBC: 3.68 MIL/uL — ABNORMAL LOW (ref 3.87–5.11)
RDW: 13.7 % (ref 11.5–15.5)
WBC: 19.4 10*3/uL — AB (ref 4.0–10.5)

## 2015-04-08 NOTE — Progress Notes (Signed)
Subjective: Postpartum Day 1 Cesarean Delivery Patient reports tolerating PO and no problems voiding.    Objective: Vital signs in last 24 hours: Temp:  [98.2 F (36.8 C)-98.8 F (37.1 C)] 98.2 F (36.8 C) (04/06 0556) Pulse Rate:  [60-66] 66 (04/06 0556) Resp:  [16-18] 18 (04/06 0556) BP: (102-119)/(55-76) 112/63 mmHg (04/06 0556) SpO2:  [95 %-99 %] 99 % (04/06 0325)  Physical Exam:  General: alert and cooperative Lochia: appropriate Uterine Fundus: firm Incision: C/D/I    Recent Labs  04/06/15 0745 04/08/15 0514  HGB 12.7 10.8*  HCT 36.0 31.0*    Assessment/Plan: Status post Cesarean section. Doing well postoperatively.  Continue current care.  Oliver PilaRICHARDSON,Macallan Ord W 04/08/2015, 8:33 AM

## 2015-04-08 NOTE — Lactation Note (Signed)
This note was copied from a baby's chart. Lactation Consultation Note  Patient Name: [name] Reason for consult: Follow-up assessment Mom reports she is breast/bottle feeding. Mom reports she only plans to BF for baby to receive colostrum then switch to formula. Mom does not want to pump/bottle feed. Encouraged Mom to BF with each feeding before giving any bottles. Supplemental guidelines per hours of age given for BR/BO and bottlefeeding only. Basic teaching reviewed. Mom declined LC assist with latch. Mom will call for questions/concerns or if she would like assist.   Maternal Data    Feeding    LATCH Score/Interventions                      Lactation Tools Discussed/Used     Consult Status Consult Status: Complete Date: 04/08/15 Follow-up type: In-patient    [name], Alizzon Dioguardi Ann Maddox, 12:24 PM

## 2015-04-09 LAB — BIRTH TISSUE RECOVERY COLLECTION (PLACENTA DONATION)

## 2015-04-09 MED ORDER — OXYCODONE HCL 5 MG PO TABS: 5.0000 mg | ORAL_TABLET | ORAL | Status: AC | PRN

## 2015-04-09 MED ORDER — IBUPROFEN 600 MG PO TABS: 600.0000 mg | ORAL_TABLET | Freq: Four times a day (QID) | ORAL | Status: AC

## 2015-04-09 NOTE — Discharge Instructions (Signed)
Nothing in vagina for 6 weeks.  No sex, tampons, and douching.  Other instructions as in [facility] Discharge Booklet. °

## 2015-04-09 NOTE — Discharge Summary (Signed)
OB Discharge Summary     Patient Name: Christine LevelMegan M Pidcock DOB: Feb 17, 1985 MRN: [id number]  Date of admission: 04/06/2015 Delivering MD: Huel CoteICHARDSON, KATHY   Date of discharge: 04/09/2015  Admitting diagnosis: INDUCTION Intrauterine pregnancy: [id number]     Secondary diagnosis:  Active Problems:   Indication for care in labor and delivery, antepartum   Arrest of dilation, delivered, current hospitalization   Cesarean delivery delivered  Additional problems: none     Discharge diagnosis: Term Pregnancy Delivered                                                                                                Post partum procedures:none  Augmentation: AROM and Pitocin  Complications: None  Hospital course:  Onset of Labor With Unplanned C/S  30 y.o. yo G2P1010 at 8662w6d was admitted in Active Labor on 04/06/2015. Patient had a labor course significant for arrest of dilatation. Membrane Rupture Time/Date: 9:11 AM ,04/06/2015   The patient went for cesarean section due to Arrest of Dilation, and delivered a Viable infant,04/07/2015  Details of operation can be found in separate operative note. Patient had an uncomplicated postpartum course.  She is ambulating,tolerating a regular diet, passing flatus, and urinating well.  Patient is discharged home in stable condition 04/09/2015.  Physical exam  Filed Vitals:   04/08/15 0325 04/08/15 0556 04/08/15 1800 04/09/15 0637  BP: 111/60 112/63 121/67 94/60  Pulse: 63 66  70  Temp: 98.7 F (37.1 C) 98.2 F (36.8 C) 97.7 F (36.5 C) 97.8 F (36.6 C)  TempSrc: Oral Oral Oral Oral  Resp: 18 18 18 18   Height:      Weight:      SpO2: 99%   99%   General: alert, cooperative and no distress Lochia: appropriate Uterine Fundus: firm Incision: Healing well with no significant drainage, Dressing is dry, and intact DVT Evaluation: No evidence of DVT seen on physical exam. Labs: Lab Results  Component Value Date   WBC 19.4* 04/08/2015   HGB 10.8*  04/08/2015   HCT 31.0* 04/08/2015   MCV 84.2 04/08/2015   PLT 203 04/08/2015   CMP Latest Ref Rng 07/08/2012  Glucose 70 - 99 mg/dL 40(J67(L)  BUN 6 - 23 mg/dL -  Creatinine 8.110.50 - 9.141.10 mg/dL -  Sodium 782135 - 956145 mEq/L -  Potassium 3.5 - 5.1 mEq/L -  Chloride 96 - 112 mEq/L -  CO2 19 - 32 mEq/L -  Calcium 8.4 - 10.5 mg/dL -  Total Protein 6.0 - 8.3 g/dL -  Total Bilirubin 0.3 - 1.2 mg/dL -  Alkaline Phos 39 - 213117 U/L -  AST 0 - 37 U/L -  ALT 0 - 35 U/L -    Discharge instruction: per After Visit Summary and "Baby and Me Booklet".  After visit meds:    Medication List    STOP taking these medications        metroNIDAZOLE 500 MG tablet  Commonly known as:  FLAGYL      TAKE these medications        cetirizine 10 MG tablet  Commonly  known as:  ZYRTEC  Take 10 mg by mouth daily.     ibuprofen 600 MG tablet  Commonly known as:  ADVIL,MOTRIN  Take 1 tablet (600 mg total) by mouth every 6 (six) hours.     oxyCODONE 5 MG immediate release tablet  Commonly known as:  Oxy IR/ROXICODONE  Take 1 tablet (5 mg total) by mouth every 4 (four) hours as needed for moderate pain or severe pain (pain scale 4-7).     PRENATAL PO  Take by mouth.        Diet: routine diet  Activity: Advance as tolerated. Pelvic rest for 6 weeks.   Outpatient follow up:2 weeks Follow up Appt:No future appointments. Follow up Visit:No Follow-up on file.  Postpartum contraception: IUD Mirena  Newborn Data: Live born female  Birth Weight: 8 lb 8.9 oz (3880 g) APGAR: 8, 9  Baby Feeding: Breast Disposition:home with mother   04/09/2015 Edwinna Areola, DO

## 2015-04-09 NOTE — Lactation Note (Signed)
This note was copied from a baby's chart. Lactation Consultation Note  Patient Name: [name] [id number]'s Date: 04/09/2015 Reason for consult: Follow-up assessment Mom not planning to continue to BF. LC reviewed how to dry her Milk. Hand pump given/demonstrated if Mom should become engorged and need to pump for comfort. Advised to only pump if needed, use cabbage leaves to dry milk. Call for questions/concerns.    Maternal Data    Feeding Feeding Type: Bottle Fed - Formula  LATCH Score/Interventions                      Lactation Tools Discussed/Used Tools: Pump Breast pump type: Manual   Consult Status Consult Status: Complete Date: 04/09/15 Follow-up type: In-patient    [name], [name] 04/09/2015, 11:42 AM

## 2015-04-09 NOTE — Progress Notes (Signed)
Patient ID: [name], female   DOB: 06-16-1985, 30 y.o.   MRN: [id number] Pt doing well- denies any fever, chills, HA or blurry vision. Lochia mild. Pain well controlled. Ready for discharge to home VSS ABD- soft, ND, incision c/d/i EXT- No homans  A/P: POD#2 s/p pltc/s        Reviewed discharge instructions         F/u in two weeks for incision check and 6 weeks for pp visit

## 2015-04-12 ENCOUNTER — Encounter (HOSPITAL_COMMUNITY): Payer: Self-pay

## 2017-04-07 IMAGING — US US OB COMP LESS 14 WK
1 series · 15 of 28 positions shown · non-contrast
Comparison: None.

CLINICAL DATA: Vaginal bleeding.

EXAM:
OBSTETRIC <14 WK ULTRASOUND
TECHNIQUE: Transabdominal ultrasound was performed for evaluation of the
gestation as well as the maternal uterus and adnexal regions.

[Series 1: us ob comp less 14 wk · 36 acquisitions, 15 frames shown]
[im 1/36]
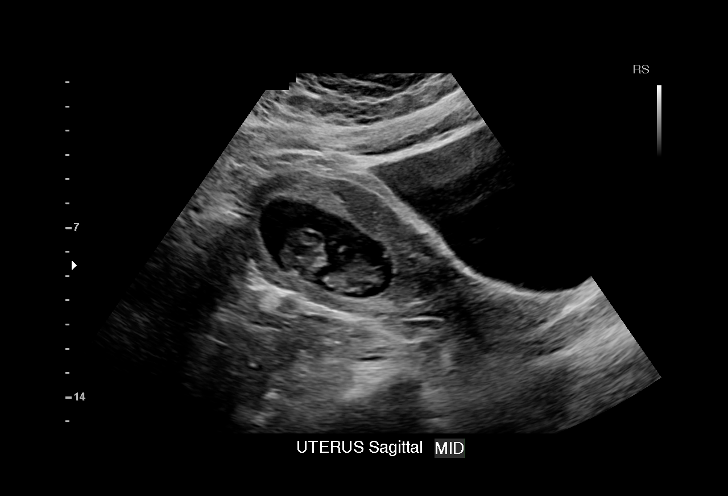
[im 3/36]
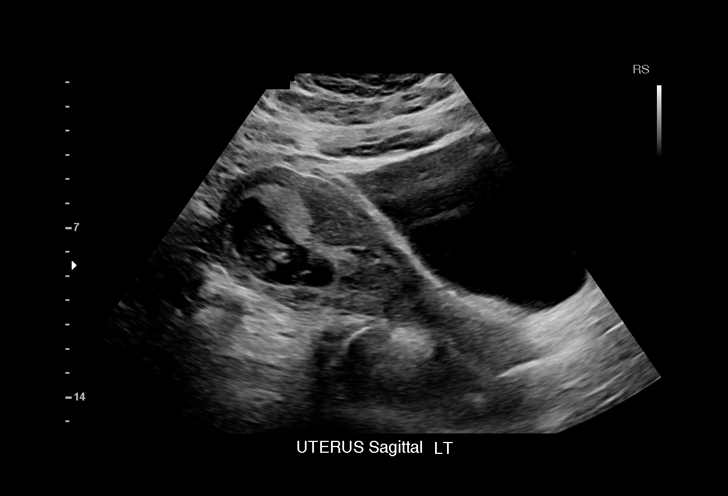
[im 6/36]
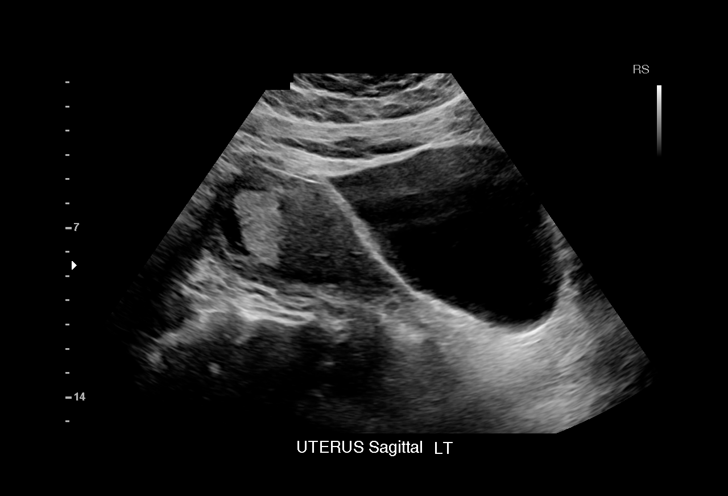
[im 8/36]
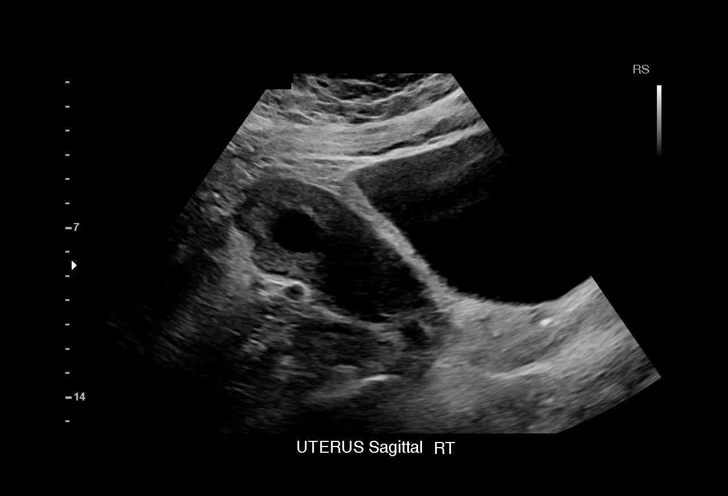
[im 11/36]
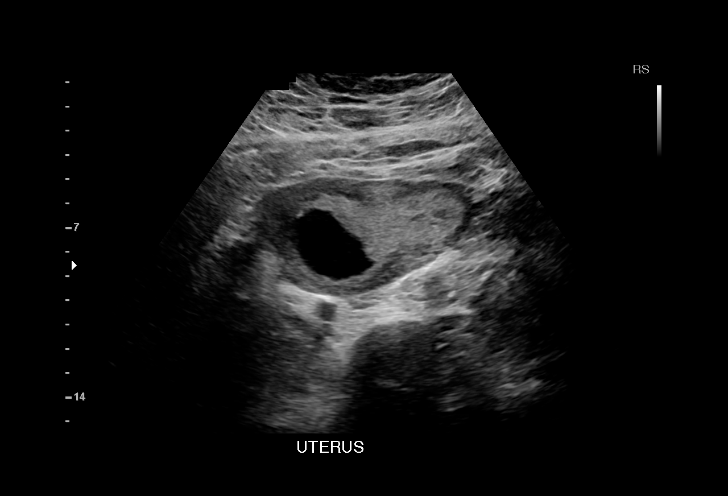
[im 13/36]
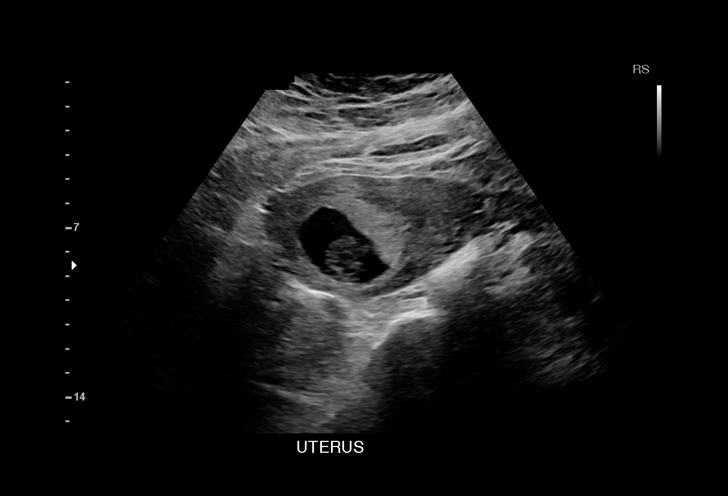
[im 16/36]
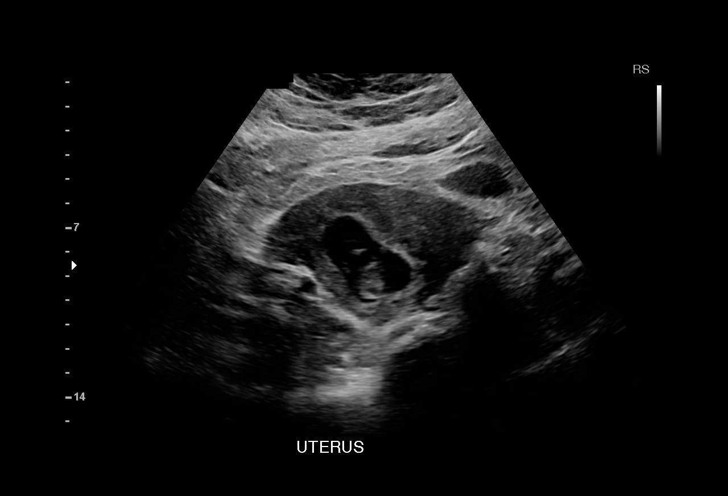
[im 19/36]
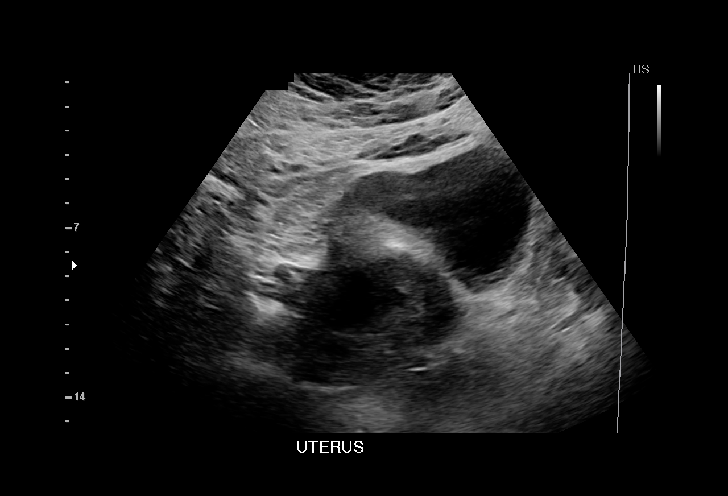
[im 20/36]
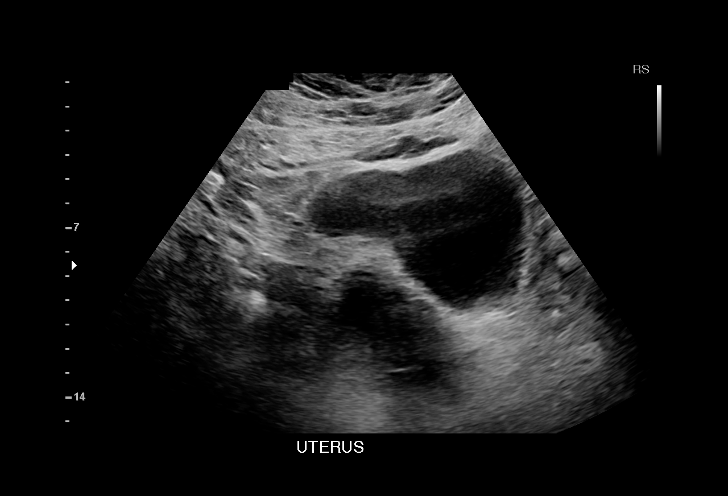
[im 23/36]
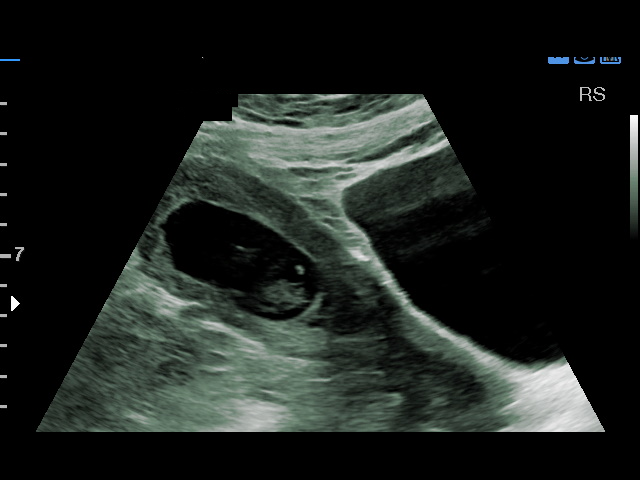
[im 25/36]
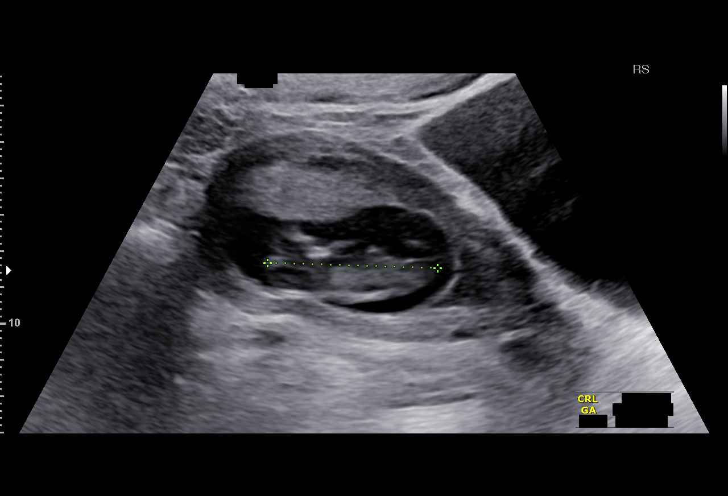
[im 28/36]
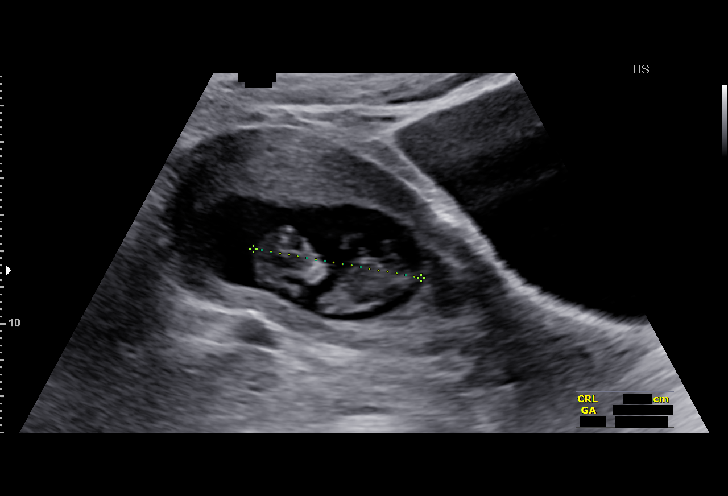
[im 30/36]
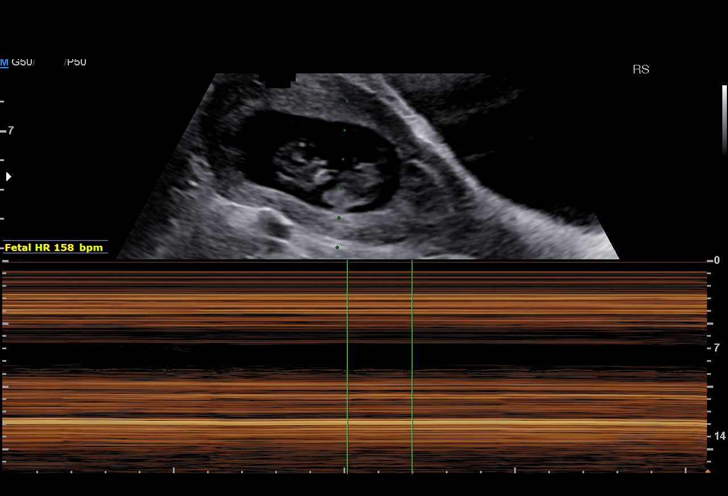
[im 33/36]
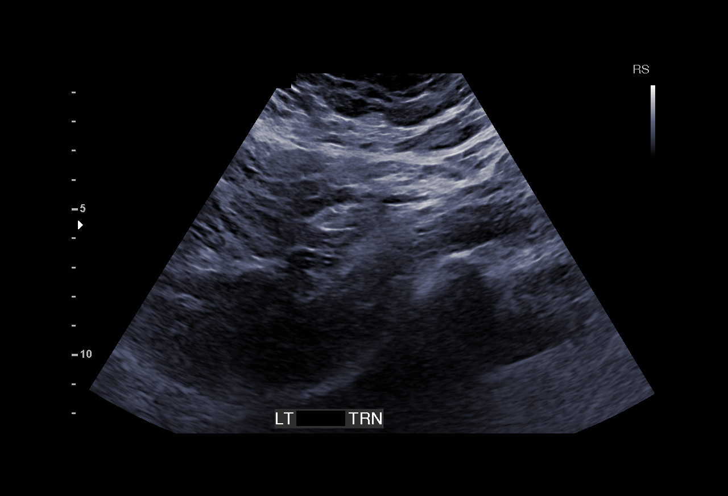
[im 36/36]
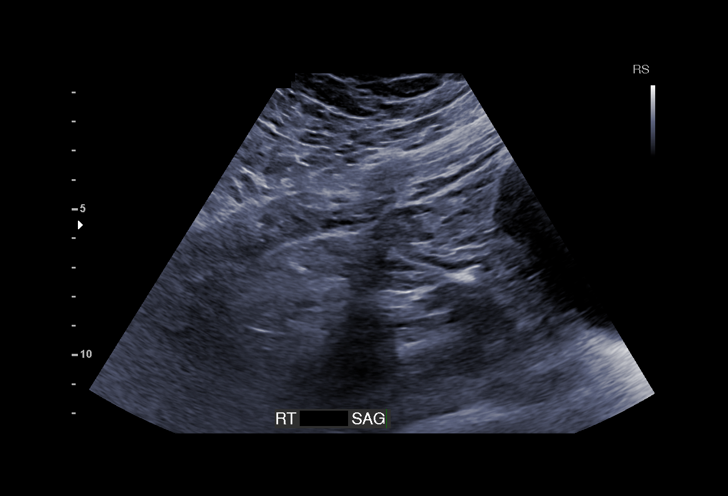

[15 of 28 positions shown; findings below may reference images not displayed]

FINDINGS: Intrauterine gestational sac: Single

Yolk sac:  No

Embryo:  Yes

Cardiac Activity: Yes

Heart Rate: 158 bpm

CRL:   48.5  mm   11 w 5 d                  US EDC: 04/04/2015

Maternal uterus/adnexae:

Subchorionic hemorrhage: Small subchorionic hemorrhage is identified
within the lower uterine segment. This measures 2.9 cm.

Right ovary: Not visualized due to overlying bowel gas

Left ovary: Normal

Other :None

Free fluid:  None
IMPRESSION: 1. Single living intrauterine gestation. The estimated gestational
age is 11 weeks and 5 days.
2. Small subchorionic hemorrhage.

## 2020-02-09 ENCOUNTER — Ambulatory Visit: Payer: Self-pay

## 2020-02-09 ENCOUNTER — Ambulatory Visit (INDEPENDENT_AMBULATORY_CARE_PROVIDER_SITE_OTHER): Payer: Self-pay

## 2020-02-09 ENCOUNTER — Ambulatory Visit (INDEPENDENT_AMBULATORY_CARE_PROVIDER_SITE_OTHER): Payer: Self-pay | Admitting: Orthopedic Surgery

## 2020-02-09 ENCOUNTER — Encounter: Payer: Self-pay | Admitting: Orthopedic Surgery

## 2020-02-09 DIAGNOSIS — M722 Plantar fascial fibromatosis: Secondary | ICD-10-CM

## 2020-02-09 DIAGNOSIS — M79672 Pain in left foot: Secondary | ICD-10-CM

## 2020-02-09 DIAGNOSIS — M79671 Pain in right foot: Secondary | ICD-10-CM

## 2020-02-09 MED ORDER — LIDOCAINE HCL 1 % IJ SOLN
2.0000 mL | INTRAMUSCULAR | Status: AC | PRN
  Administered 2020-02-09: 2 mL

## 2020-02-09 MED ORDER — METHYLPREDNISOLONE ACETATE 40 MG/ML IJ SUSP
40.0000 mg | INTRAMUSCULAR | Status: AC | PRN
  Administered 2020-02-09: 40 mg

## 2020-02-09 NOTE — Progress Notes (Signed)
Office Visit Note   Patient: [name]           Date of Birth: 04/17/1985           MRN: [id number] Visit Date: 02/09/2020              Requested by: Lonie Peak, PA-C [address] [place],  [place] [address] PCP: Lonie Peak, PA-C  No chief complaint on file.     HPI: Patient is a 35 year old woman who has had plantar fasciitis since November.  Patient has used stiff soled supportive sneakers she is worked on Achilles stretching she has used anti-inflammatories without relief she states the pain is worse on the left than the right with start up pain.  Assessment & Plan: Visit Diagnoses:  1. Bilateral foot pain   2. Plantar fasciitis, bilateral     Plan: She will continue with the Achilles stretching this was demonstrated she was given instructions for fascial strengthening both plantar fascia was injected follow-up as needed.  Follow-Up Instructions: Return if symptoms worsen or fail to improve.   Ortho Exam  Patient is alert, oriented, no adenopathy, well-dressed, normal affect, normal respiratory effort. Examination patient has a good dorsalis pedis pulse bilaterally she has good ankle good subtalar motion good dorsiflexion about 30 degrees past neutral.  Lateral compression of the calcaneus is minimally tender to palpation no tenderness to palpation over the tarsal tunnel bilaterally she is point tender to palpation of the origin of the plantar fascia left worse than right.  Imaging: XR Foot 2 Views Left  Result Date: 02/09/2020 2 view radiographs of the left foot shows a large calcaneal heel spur with no stress fractures or arthritic changes.  XR Foot 2 Views Right  Result Date: 02/09/2020 2 view radiographs of the right foot shows a calcaneal heel spur no stress fractures noted joint space irregularity.  No images are attached to the encounter.  Labs: Lab Results  Component Value Date   HGBA1C 5.1 05/01/2011     Lab Results  Component Value  Date   ALBUMIN 3.9 09/01/2010    No results found for: MG No results found for: VD25OH  No results found for: PREALBUMIN CBC EXTENDED Latest Ref Rng & Units 04/08/2015 04/06/2015 07/08/2012  WBC 4.0 - 10.5 K/uL 19.4(H) 12.5(H) 8.4  RBC 3.87 - 5.11 MIL/uL 3.68(L) 4.33 4.87  HGB 12.0 - 15.0 g/dL 10.8(L) 12.7 14.2  HCT 36.0 - 46.0 % 31.0(L) 36.0 41.5  PLT 150 - 400 K/uL 203 236 300  NEUTROABS 1.7 - 7.7 K/uL - - 4.9  LYMPHSABS 0.7 - 4.0 K/uL - - 2.2     There is no height or weight on file to calculate BMI.  Orders:  Orders Placed This Encounter  Procedures  . XR Foot 2 Views Right  . XR Foot 2 Views Left   No orders of the defined types were placed in this encounter.    Procedures: Foot Inj  Date/Time: 02/09/2020 9:32 AM Performed by: Nadara Mustard, MD Authorized by: Nadara Mustard, MD   Consent Given by:  Patient Site marked: the procedure site was marked   Timeout: prior to procedure the correct patient, procedure, and site was verified   Indications:  Fasciitis and pain Condition: Plantar Fasciitis   Location: left plantar fascia muscle   Prep: patient was prepped and draped in usual sterile fashion   Needle Size:  22 G Medications:  2 mL lidocaine 1 %; 40 mg methylPREDNISolone  acetate 40 MG/ML Patient Tolerance:  Patient tolerated the procedure well with no immediate complications Foot Inj  Date/Time: 02/09/2020 9:32 AM Performed by: Nadara Mustard, MD Authorized by: Nadara Mustard, MD   Consent Given by:  Patient Site marked: the procedure site was marked   Timeout: prior to procedure the correct patient, procedure, and site was verified   Indications:  Fasciitis and pain Condition: Plantar Fasciitis   Location: right plantar fascia muscle   Prep: patient was prepped and draped in usual sterile fashion   Needle Size:  22 G Medications:  2 mL lidocaine 1 %; 40 mg methylPREDNISolone acetate 40 MG/ML Patient Tolerance:  Patient tolerated the procedure well with no  immediate complications    Clinical Data: No additional findings.  ROS:  All other systems negative, except as noted in the HPI. Review of Systems  Objective: Vital Signs: There were no vitals taken for this visit.  Specialty Comments:  No specialty comments available.  PMFS History: Patient Active Problem List   Diagnosis Date Noted  . Cesarean delivery delivered 04/09/2015  . Arrest of dilation, delivered, current hospitalization 04/07/2015  . Indication for care in labor and delivery, antepartum 04/06/2015  . Depression   . IUD   . PCO (polycystic ovaries)   . Vitamin B12 deficiency   . Cholelithiasis without obstruction 08/23/2010   Past Medical History:  Diagnosis Date  . Depression   . Gallstones 2012  . Hx of varicella   . PCOS (polycystic ovarian syndrome) 2012  . Vitamin D deficiency     Family History  Problem Relation Age of Onset  . Hypertension Father   . Heart disease Father     Past Surgical History:  Procedure Laterality Date  . CESAREAN SECTION N/A 04/07/2015   Procedure: CESAREAN SECTION;  Surgeon: Huel Cote, MD;  Location: [facility];  Service: Obstetrics;  Laterality: N/A;  . CHOLECYSTECTOMY    . MIRENA     Inserted 08-2007  . WISDOM TOOTH EXTRACTION  2003   Social History   Occupational History  . Not on file  Tobacco Use  . Smoking status: Never Smoker  . Smokeless tobacco: Never Used  Substance and Sexual Activity  . Alcohol use: No    Alcohol/week: 6.0 standard drinks    Types: 6 Standard drinks or equivalent per week  . Drug use: No  . Sexual activity: Yes    Birth control/protection: None    Comment: Mirena inserted 08-28-07
